# Patient Record
Sex: Female | Born: 1970 | Race: White | Hispanic: No | Marital: Married
Health system: Southern US, Community
[De-identification: ages and names within clinical notes are randomized; demographics above are authoritative.]

## PROBLEM LIST (undated history)

## (undated) DIAGNOSIS — F32A Depression, unspecified: Secondary | ICD-10-CM

## (undated) DIAGNOSIS — R011 Cardiac murmur, unspecified: Secondary | ICD-10-CM

## (undated) DIAGNOSIS — D649 Anemia, unspecified: Secondary | ICD-10-CM

## (undated) DIAGNOSIS — F329 Major depressive disorder, single episode, unspecified: Secondary | ICD-10-CM

## (undated) DIAGNOSIS — T7840XA Allergy, unspecified, initial encounter: Secondary | ICD-10-CM

## (undated) HISTORY — DX: Depression, unspecified: F32.A

## (undated) HISTORY — DX: Anemia, unspecified: D64.9

## (undated) HISTORY — DX: Cardiac murmur, unspecified: R01.1

## (undated) HISTORY — PX: OTHER SURGICAL HISTORY: SHX169

## (undated) HISTORY — DX: Major depressive disorder, single episode, unspecified: F32.9

## (undated) HISTORY — DX: Allergy, unspecified, initial encounter: T78.40XA

---

## 2017-05-15 DIAGNOSIS — Z23 Encounter for immunization: Secondary | ICD-10-CM | POA: Diagnosis not present

## 2017-05-26 DIAGNOSIS — Z0000 Encounter for general adult medical examination without abnormal findings: Secondary | ICD-10-CM | POA: Diagnosis not present

## 2017-05-26 DIAGNOSIS — D509 Iron deficiency anemia, unspecified: Secondary | ICD-10-CM | POA: Diagnosis not present

## 2017-05-26 DIAGNOSIS — R221 Localized swelling, mass and lump, neck: Secondary | ICD-10-CM | POA: Diagnosis not present

## 2017-06-06 DIAGNOSIS — D170 Benign lipomatous neoplasm of skin and subcutaneous tissue of head, face and neck: Secondary | ICD-10-CM | POA: Diagnosis not present

## 2017-06-06 DIAGNOSIS — Z1231 Encounter for screening mammogram for malignant neoplasm of breast: Secondary | ICD-10-CM | POA: Diagnosis not present

## 2017-06-29 DIAGNOSIS — D170 Benign lipomatous neoplasm of skin and subcutaneous tissue of head, face and neck: Secondary | ICD-10-CM | POA: Diagnosis not present

## 2017-07-22 DIAGNOSIS — D170 Benign lipomatous neoplasm of skin and subcutaneous tissue of head, face and neck: Secondary | ICD-10-CM | POA: Diagnosis not present

## 2017-07-22 DIAGNOSIS — D1779 Benign lipomatous neoplasm of other sites: Secondary | ICD-10-CM | POA: Diagnosis not present

## 2017-07-24 DIAGNOSIS — Z87891 Personal history of nicotine dependence: Secondary | ICD-10-CM | POA: Diagnosis not present

## 2017-07-24 DIAGNOSIS — J392 Other diseases of pharynx: Secondary | ICD-10-CM | POA: Diagnosis not present

## 2017-07-24 DIAGNOSIS — R220 Localized swelling, mass and lump, head: Secondary | ICD-10-CM | POA: Diagnosis not present

## 2017-07-24 DIAGNOSIS — L299 Pruritus, unspecified: Secondary | ICD-10-CM | POA: Diagnosis not present

## 2017-07-31 DIAGNOSIS — H5213 Myopia, bilateral: Secondary | ICD-10-CM | POA: Diagnosis not present

## 2018-01-28 ENCOUNTER — Ambulatory Visit (INDEPENDENT_AMBULATORY_CARE_PROVIDER_SITE_OTHER): Payer: BLUE CROSS/BLUE SHIELD | Admitting: Emergency Medicine

## 2018-01-28 ENCOUNTER — Telehealth: Payer: Self-pay | Admitting: *Deleted

## 2018-01-28 ENCOUNTER — Other Ambulatory Visit: Payer: Self-pay

## 2018-01-28 ENCOUNTER — Encounter: Payer: Self-pay | Admitting: Emergency Medicine

## 2018-01-28 ENCOUNTER — Ambulatory Visit (HOSPITAL_COMMUNITY)
Admission: RE | Admit: 2018-01-28 | Discharge: 2018-01-28 | Disposition: A | Discharge status: Home / Self Care | Payer: BLUE CROSS/BLUE SHIELD | Source: Ambulatory Visit | Attending: Emergency Medicine | Admitting: Emergency Medicine

## 2018-01-28 VITALS — BP 110/70 | HR 83 | Temp 98.0°F | Resp 16 | Ht 62.0 in | Wt 148.8 lb

## 2018-01-28 DIAGNOSIS — R102 Pelvic and perineal pain: Secondary | ICD-10-CM | POA: Diagnosis not present

## 2018-01-28 DIAGNOSIS — M5440 Lumbago with sciatica, unspecified side: Secondary | ICD-10-CM | POA: Diagnosis not present

## 2018-01-28 LAB — POCT URINALYSIS DIP (MANUAL ENTRY)
Bilirubin, UA: NEGATIVE
Blood, UA: NEGATIVE
Glucose, UA: NEGATIVE mg/dL
Ketones, POC UA: NEGATIVE mg/dL
Leukocytes, UA: NEGATIVE
Nitrite, UA: NEGATIVE
Protein Ur, POC: NEGATIVE mg/dL
Spec Grav, UA: 1.01 (ref 1.010–1.025)
Urobilinogen, UA: 0.2 E.U./dL
pH, UA: 6.5 (ref 5.0–8.0)

## 2018-01-28 LAB — POC MICROSCOPIC URINALYSIS (UMFC): Mucus: ABSENT

## 2018-01-28 NOTE — Progress Notes (Signed)
[facility] 47 [facility]   Chief Complaint  Patient presents with  . New pt  . Back Pain    lower x 1 wk ago, "moved to the front, right lower abdomen, now in the left mid back x 2 days ago    HISTORY OF PRESENT ILLNESS: This is a 47 y.o. female complaining of pain to right lower back 1 week ago.  Pain then moved to the right lower abdomen and now is on the left low back for the past 2 days.  Still eating and drinking well.  Denies nausea or vomiting.  No fever or chills.  No abnormal vaginal discharge or bleeding.  Denies urinary symptoms or blood in the urine.  No other significant symptomatology.  Denies injuries.  Describes pain as a dull ache and intermittent.  No associated symptoms.  HPI   Prior to Admission medications   Not on File    Allergies not on file  There are no active problems to display for this patient.   Past Medical History:  Diagnosis Date  . Allergy   . Anemia   . Depression   . Heart murmur       Social History   Socioeconomic History  . Marital status: Married    Spouse name: Not on file  . Number of children: Not on file  . Years of education: Not on file  . Highest education level: Not on file  Occupational History  . Not on file  Social Needs  . Financial resource strain: Not on file  . Food insecurity:    Worry: Not on file    Inability: Not on file  . Transportation needs:    Medical: Not on file    Non-medical: Not on file  Tobacco Use  . Smoking status: Former Games developermoker  . Smokeless tobacco: Never Used  Substance and Sexual Activity  . Alcohol use: Yes    Alcohol/week: 0.6 oz    Types: 1 Standard drinks or equivalent per week  . Drug use: Never  . Sexual activity: Not on file  Lifestyle  . Physical activity:    Days per week: Not on file    Minutes per session: Not on file  . Stress: Not on file  Relationships  . Social connections:    Talks on phone: Not on file    Gets together: Not on file    Attends religious  service: Not on file    Active member of club or organization: Not on file    Attends meetings of clubs or organizations: Not on file    Relationship status: Not on file  . Intimate partner violence:    Fear of current or ex partner: Not on file    Emotionally abused: Not on file    Physically abused: Not on file    Forced sexual activity: Not on file  Other Topics Concern  . Not on file  Social History Narrative  . Not on file    Family History  Problem Relation Age of Onset  . Diabetes Mother   . Hyperlipidemia Mother   . Hypertension Mother   . Heart disease Maternal Grandmother   . Cancer Maternal Grandfather        Bone cancer  . Heart disease Paternal Grandfather      Review of Systems  Constitutional: Negative.  Negative for chills and fever.  HENT: Negative.  Negative for sore throat.   Eyes: Negative.   Respiratory: Negative.  Negative for  cough and shortness of breath.   Cardiovascular: Negative.  Negative for chest pain, palpitations and leg swelling.  Gastrointestinal: Negative.  Negative for abdominal pain, blood in stool, diarrhea, nausea and vomiting.  Genitourinary: Negative.  Negative for dysuria, flank pain, frequency, hematuria and urgency.  Musculoskeletal: Negative.  Negative for back pain, myalgias and neck pain.  Skin: Negative.  Negative for rash.  Neurological: Negative.  Negative for dizziness and headaches.  Endo/Heme/Allergies: Negative.   All other systems reviewed and are negative.  Vitals:   01/28/18 1119  BP: 110/70  Pulse: 83  Resp: 16  Temp: 98 F (36.7 C)  SpO2: 99%     Physical Exam  Constitutional: She is oriented to person, place, and time. She appears well-developed and well-nourished.  HENT:  Head: Normocephalic and atraumatic.  Nose: Nose normal.  Mouth/Throat: Oropharynx is clear and moist.  Eyes: Pupils are equal, round, and reactive to light. Conjunctivae and EOM are normal.  Neck: Normal range of motion. Neck  supple. No JVD present.  Cardiovascular: Normal rate, regular rhythm and normal heart sounds.  Pulmonary/Chest: Effort normal and breath sounds normal.  Abdominal: Soft. She exhibits no distension. There is no hepatosplenomegaly. There is tenderness in the right lower quadrant. There is no rebound and no guarding.    Musculoskeletal: Normal range of motion.       Lumbar back: Normal. She exhibits normal range of motion, no tenderness, no bony tenderness, no swelling, no edema, no spasm and normal pulse.  Lymphadenopathy:    She has no cervical adenopathy.  Neurological: She is alert and oriented to person, place, and time.  Skin: Skin is warm and dry. Capillary refill takes less than 2 seconds.  Psychiatric: She has a normal mood and affect. Her behavior is normal.  Vitals reviewed.   Results for orders placed or performed in visit on 01/28/18 (from the past 24 hour(s))  POCT urinalysis dipstick     Status: None   Collection Time: 01/28/18 11:37 AM  Result Value Ref Range   Color, UA yellow yellow   Clarity, UA clear clear   Glucose, UA negative negative mg/dL   Bilirubin, UA negative negative   Ketones, POC UA negative negative mg/dL   Spec Grav, UA 6.213 0.865 - 1.025   Blood, UA negative negative   pH, UA 6.5 5.0 - 8.0   Protein Ur, POC negative negative mg/dL   Urobilinogen, UA 0.2 0.2 or 1.0 E.U./dL   Nitrite, UA Negative Negative   Leukocytes, UA Negative Negative  POCT Microscopic Urinalysis ([facility])     Status: Abnormal   Collection Time: 01/28/18 11:42 AM  Result Value Ref Range   WBC,UR,HPF,POC None None WBC/hpf   RBC,UR,HPF,POC None None RBC/hpf   Bacteria None None, Too numerous to count   Mucus Absent Absent   Epithelial Cells, UR Per Microscopy Few (A) None, Too numerous to count cells/hpf    ASSESSMENT & PLAN: Alyzae was seen today for new pt and back pain.  Diagnoses and all orders for this visit:  Midline low back pain with sciatica, sciatica laterality  unspecified, unspecified chronicity -     POCT urinalysis dipstick -     POCT Microscopic Urinalysis ([facility]) -     Urine Culture  Pelvic pain -     CBC with Differential/Platelet -     Comprehensive metabolic panel -     Cancel: US Pelvis Complete; Future -     Cancel: US PELVIC COMPLETE WITH TRANSVAGINAL; Future -  US PELVIC COMPLETE WITH TRANSVAGINAL; Future    Patient Instructions   [facility] AT 2:00 pm TODAY. PLEASE ARRIVE AT 1:45 PM. GO TO THE RADIOLOGY DEPARTMENT FOR A PELVIC ULTRASOUND.    IF you received an x-ray today, you will receive an invoice from [facility] Radiology. Please contact [facility] at [phone] with questions or concerns regarding your invoice.   IF you received labwork today, you will receive an invoice from [place]. Please contact [facility] at [phone] with questions or concerns regarding your invoice.   Our billing staff will not be able to assist you with questions regarding bills from these companies.  You will be contacted with the lab results as soon as they are available. The fastest way to get your results is to activate your My Chart account. Instructions are located on the last page of this paperwork. If you have not heard from [place] regarding the results in 2 weeks, please contact this office.     Abdominal Pain, Adult Many things can cause belly (abdominal) pain. Most times, belly pain is not dangerous. Many cases of belly pain can be watched and treated at home. Sometimes belly pain is serious, though. Your doctor will try to find the cause of your belly pain. Follow these instructions at home:  Take over-the-counter and prescription medicines only as told by your doctor. Do not take medicines that help you poop (laxatives) unless told to by your doctor.  Drink enough fluid to keep your pee (urine) clear or pale yellow.  Watch your belly pain for any changes.  Keep all follow-up visits as told by your doctor.  This is important. Contact a doctor if:  Your belly pain changes or gets worse.  You are not hungry, or you lose weight without trying.  You are having trouble pooping (constipated) or have watery poop (diarrhea) for more than 2-3 days.  You have pain when you pee or poop.  Your belly pain wakes you up at night.  Your pain gets worse with meals, after eating, or with certain foods.  You are throwing up and cannot keep anything down.  You have a fever. Get help right away if:  Your pain does not go away as soon as your doctor says it should.  You cannot stop throwing up.  Your pain is only in areas of your belly, such as the right side or the left lower part of the belly.  You have bloody or black poop, or poop that looks like tar.  You have very bad pain, cramping, or bloating in your belly.  You have signs of not having enough fluid or water in your body (dehydration), such as: ? Dark pee, very little pee, or no pee. ? Cracked lips. ? Dry mouth. ? Sunken eyes. ? Sleepiness. ? Weakness. This information is not intended to replace advice given to you by your health care provider. Make sure you discuss any questions you have with your health care provider. Document Released: 12/17/2007 Document Revised: 01/18/2016 Document Reviewed: 12/12/2015 Elsevier Interactive Patient Education  2018 Elsevier Inc.      Edwina Barth, MD [facility]

## 2018-01-28 NOTE — Patient Instructions (Addendum)
[facility] AT 2:00 pm TODAY. PLEASE ARRIVE AT 1:45 PM. GO TO THE RADIOLOGY DEPARTMENT FOR A PELVIC ULTRASOUND.    IF you received an x-ray today, you will [facility] an [facility]. Please contact [facility] at [phone] with questions or concerns regarding your invoice.   IF you received labwork today, you will receive an invoice from [facility]. Please contact [facility] at [phone] with questions or concerns regarding your invoice.   Our billing staff will not be able to assist you with questions regarding bills from these companies.  You will be contacted with the lab results as soon as they are available. The fastest way to get your results is to activate your My Chart account. Instructions are located on the last page of this paperwork. If you have not heard from us regarding the results in 2 weeks, please contact this office.     Abdominal Pain, Adult Many things can cause belly (abdominal) pain. Most times, belly pain is not dangerous. Many cases of belly pain can be watched and treated at home. Sometimes belly pain is serious, though. Your doctor will try to find the cause of your belly pain. Follow these instructions at home:  Take over-the-counter and prescription medicines only as told by your doctor. Do not take medicines that help you poop (laxatives) unless told to by your doctor.  Drink enough fluid to keep your pee (urine) clear or pale yellow.  Watch your belly pain for any changes.  Keep all follow-up visits as told by your doctor. This is important. Contact a doctor if:  Your belly pain changes or gets worse.  You are not hungry, or you lose weight without trying.  You are having trouble pooping (constipated) or have watery poop (diarrhea) for more than 2-3 days.  You have pain when you pee or poop.  Your belly pain wakes you up at night.  Your pain gets worse with meals, after eating, or with certain foods.  You are  throwing up and cannot keep anything down.  You have a fever. Get help right away if:  Your pain does not go away as soon as your doctor says it should.  You cannot stop throwing up.  Your pain is only in areas of your belly, such as the right side or the left lower part of the belly.  You have bloody or black poop, or poop that looks like tar.  You have very bad pain, cramping, or bloating in your belly.  You have signs of not having enough fluid or water in your body (dehydration), such as: ? Dark pee, very little pee, or no pee. ? Cracked lips. ? Dry mouth. ? Sunken eyes. ? Sleepiness. ? Weakness. This information is not intended to replace advice given to you by your health care provider. Make sure you discuss any questions you have with your health care provider. Document Released: 12/17/2007 Document Revised: 01/18/2016 Document Reviewed: 12/12/2015 Elsevier Interactive Patient Education  2018 [employer]

## 2018-01-28 NOTE — Telephone Encounter (Signed)
Spoke with Ginger, Scheduler. Patient given AVS with information [facility] at 1:45 pm for a 2:00 pm appointment with a full bladder. Pt advised to arrive  at the Radiology Department. Ginger was advised that the patient doesn't have to wait, she may leave and the call back number [phone] per Dr. Alvy BimlerSagardia was given for a call report.

## 2018-01-29 ENCOUNTER — Telehealth: Payer: Self-pay | Admitting: Emergency Medicine

## 2018-01-29 LAB — URINE CULTURE

## 2018-01-29 LAB — CBC WITH DIFFERENTIAL/PLATELET
Basophils Absolute: 0 10*3/uL (ref 0.0–0.2)
Basos: 1 %
EOS (ABSOLUTE): 0.1 10*3/uL (ref 0.0–0.4)
Eos: 1 %
Hematocrit: 34.6 % (ref 34.0–46.6)
Hemoglobin: 10.7 g/dL — ABNORMAL LOW (ref 11.1–15.9)
Immature Grans (Abs): 0 10*3/uL (ref 0.0–0.1)
Immature Granulocytes: 0 %
Lymphocytes Absolute: 2.1 10*3/uL (ref 0.7–3.1)
Lymphs: 41 %
MCH: 20 pg — ABNORMAL LOW (ref 26.6–33.0)
MCHC: 30.9 g/dL — ABNORMAL LOW (ref 31.5–35.7)
MCV: 65 fL — ABNORMAL LOW (ref 79–97)
Monocytes Absolute: 0.2 10*3/uL (ref 0.1–0.9)
Monocytes: 4 %
Neutrophils Absolute: 2.8 10*3/uL (ref 1.4–7.0)
Neutrophils: 53 %
Platelets: 271 10*3/uL (ref 150–450)
RBC: 5.34 x10E6/uL — ABNORMAL HIGH (ref 3.77–5.28)
RDW: 16.4 % — ABNORMAL HIGH (ref 12.3–15.4)
WBC: 5.3 10*3/uL (ref 3.4–10.8)

## 2018-01-29 LAB — COMPREHENSIVE METABOLIC PANEL
ALT: 17 IU/L (ref 0–32)
AST: 13 IU/L (ref 0–40)
Albumin/Globulin Ratio: 1.9 (ref 1.2–2.2)
Albumin: 4.8 g/dL (ref 3.5–5.5)
Alkaline Phosphatase: 61 IU/L (ref 39–117)
BUN/Creatinine Ratio: 16 (ref 9–23)
BUN: 12 mg/dL (ref 6–24)
Bilirubin Total: 1.8 mg/dL — ABNORMAL HIGH (ref 0.0–1.2)
CO2: 24 mmol/L (ref 20–29)
Calcium: 9.8 mg/dL (ref 8.7–10.2)
Chloride: 103 mmol/L (ref 96–106)
Creatinine, Ser: 0.74 mg/dL (ref 0.57–1.00)
GFR calc Af Amer: 110 mL/min/{1.73_m2} (ref >59)
GFR calc non Af Amer: 95 mL/min/{1.73_m2} (ref >59)
Globulin, Total: 2.5 g/dL (ref 1.5–4.5)
Glucose: 83 mg/dL (ref 65–99)
Potassium: 4.2 mmol/L (ref 3.5–5.2)
Sodium: 142 mmol/L (ref 134–144)
Total Protein: 7.3 g/dL (ref 6.0–8.5)

## 2018-01-29 NOTE — Telephone Encounter (Signed)
Lab results and ultrasound results discussed with patient.  She is doing better today.

## 2018-01-30 ENCOUNTER — Encounter: Payer: Self-pay | Admitting: *Deleted

## 2018-01-30 NOTE — Progress Notes (Signed)
Letter sent.

## 2018-10-31 IMAGING — US US PELVIS COMPLETE TRANSABD/TRANSVAG
1 series · 14 of 25 positions shown · non-contrast
Comparison: None

CLINICAL DATA: Pelvic pain



[Series 1: us pelvis complete transabd/transvag · 0.24mm/px · 14 of 82 slices shown]
[im 1/82]
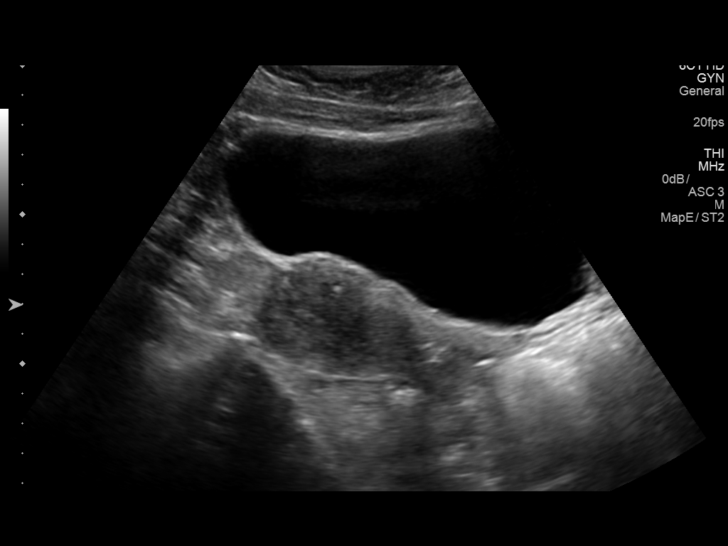
[im 7/82]
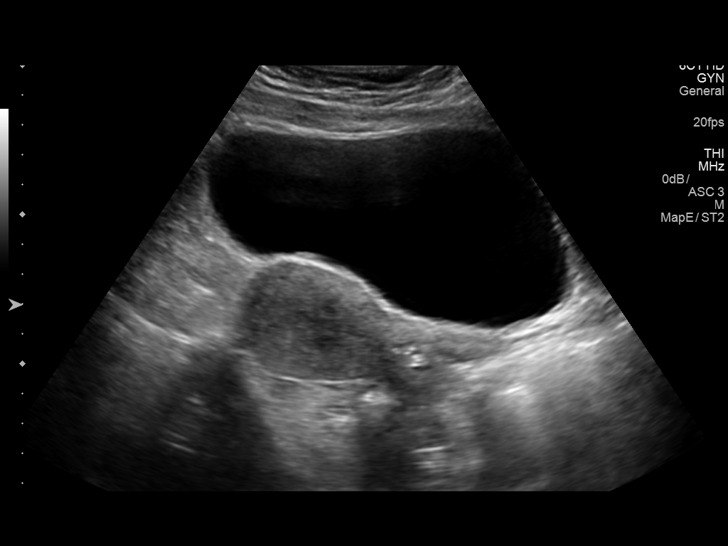
[im 14/82]
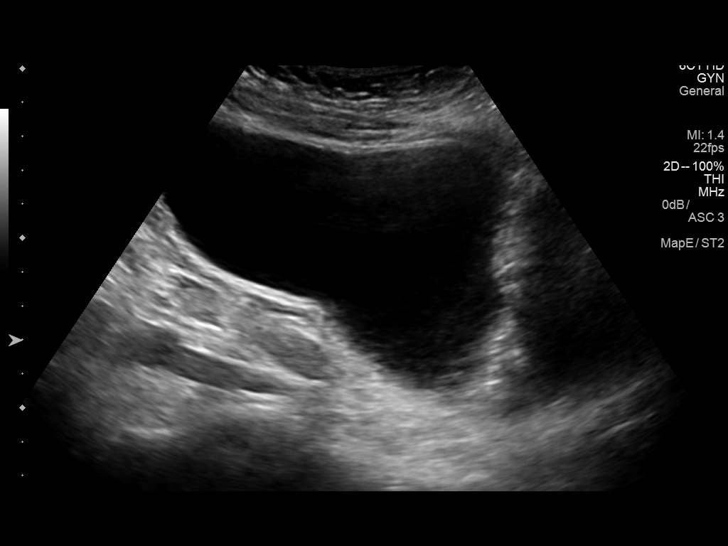
[im 21/82]
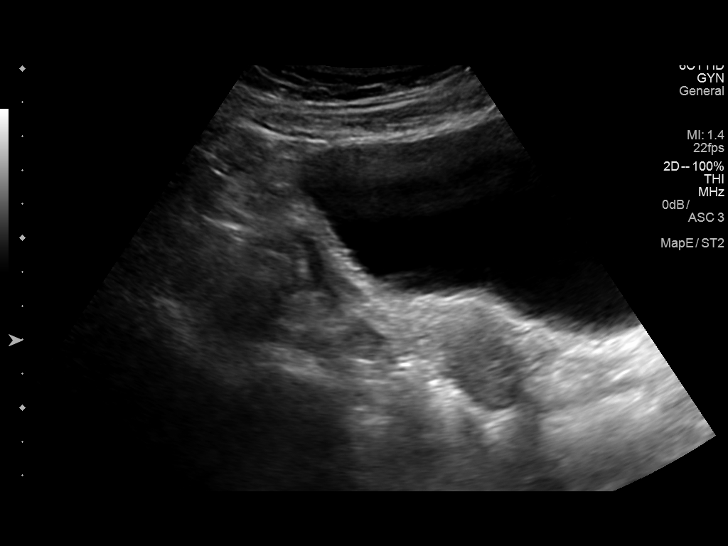
[im 28/82]
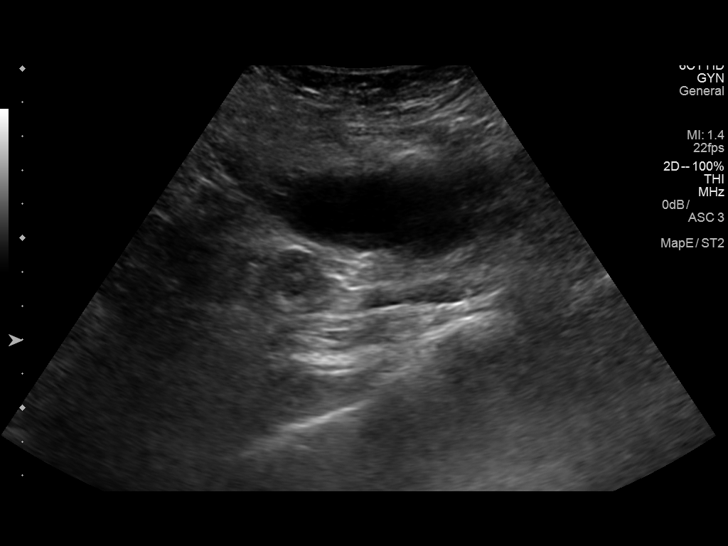
[im 31/82]
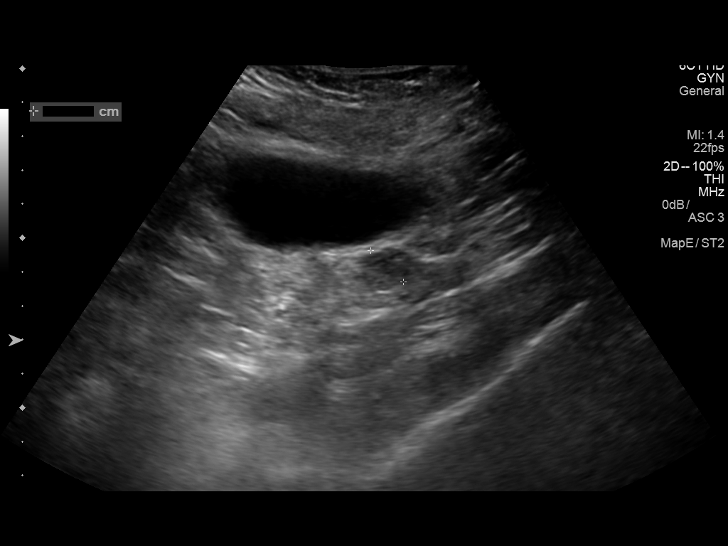
[im 38/82]
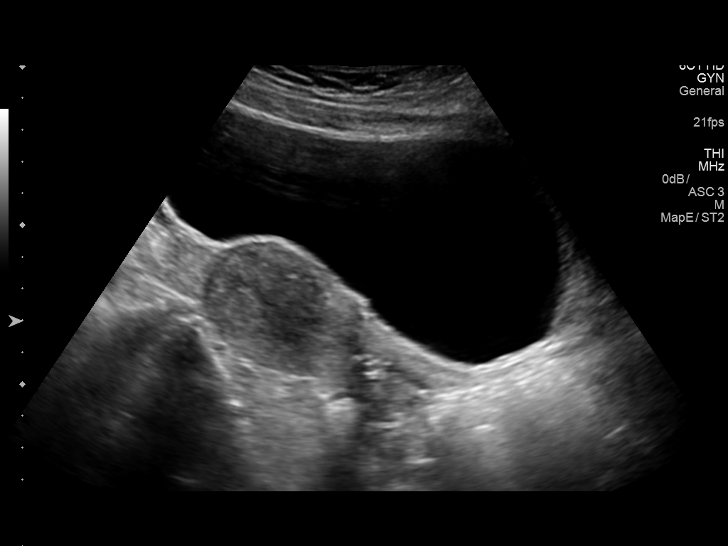
[im 44/82]
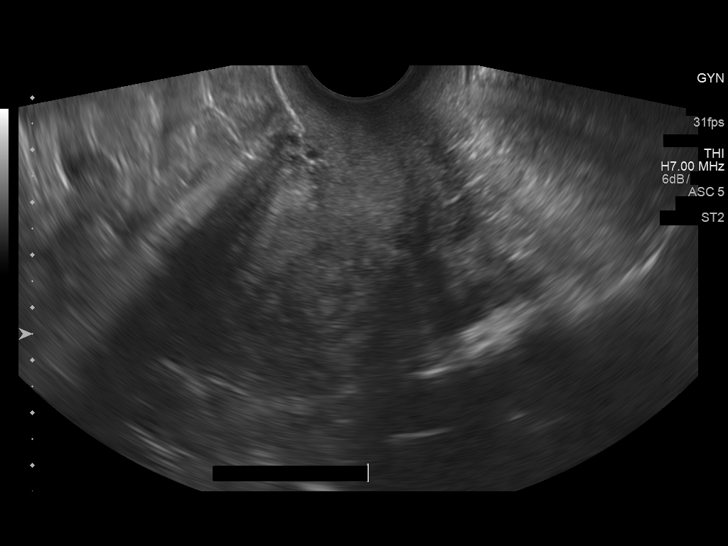
[im 51/82]
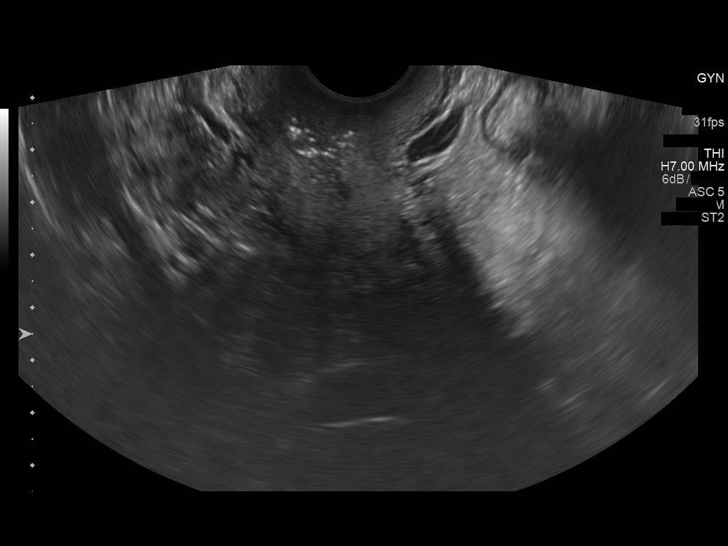
[im 55/82]
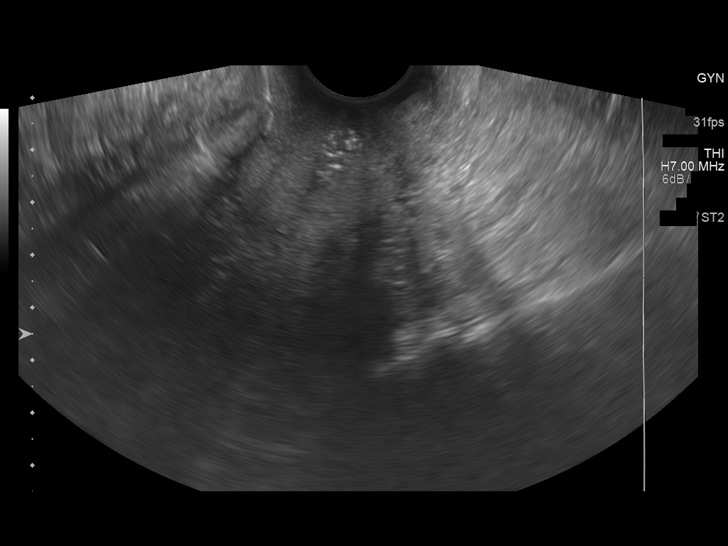
[im 61/82]
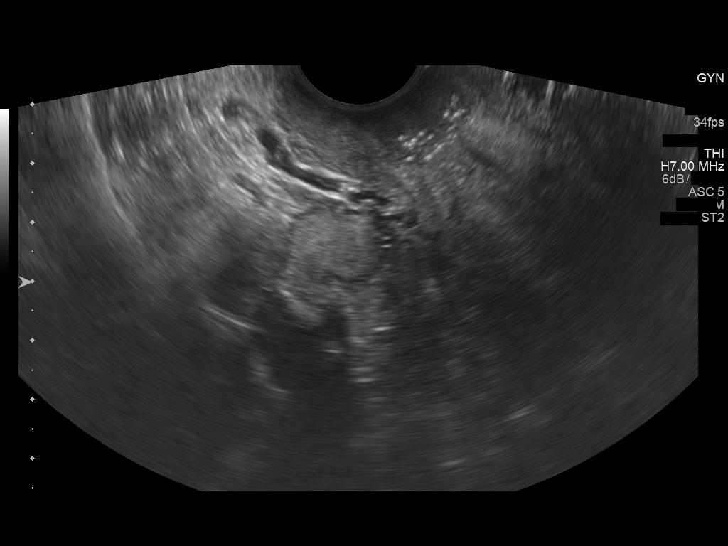
[im 68/82]
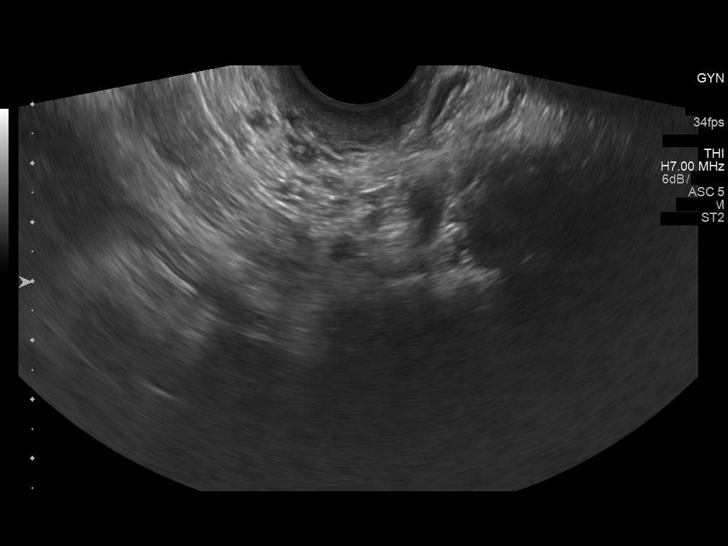
[im 75/82]
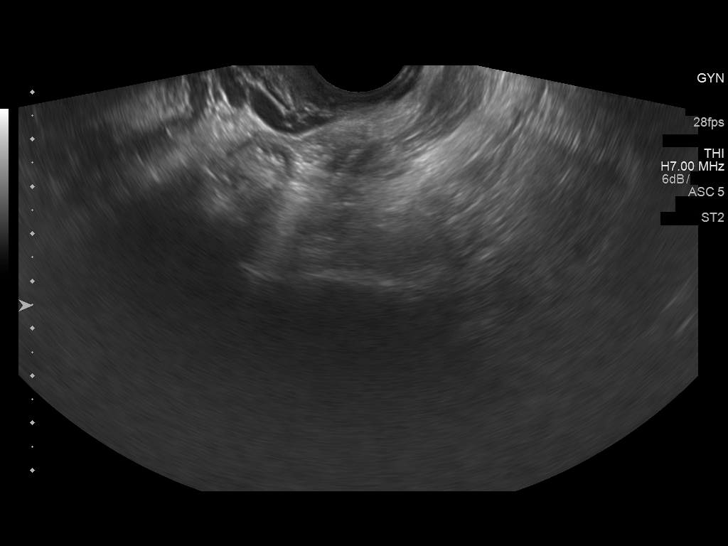
[im 82/82]
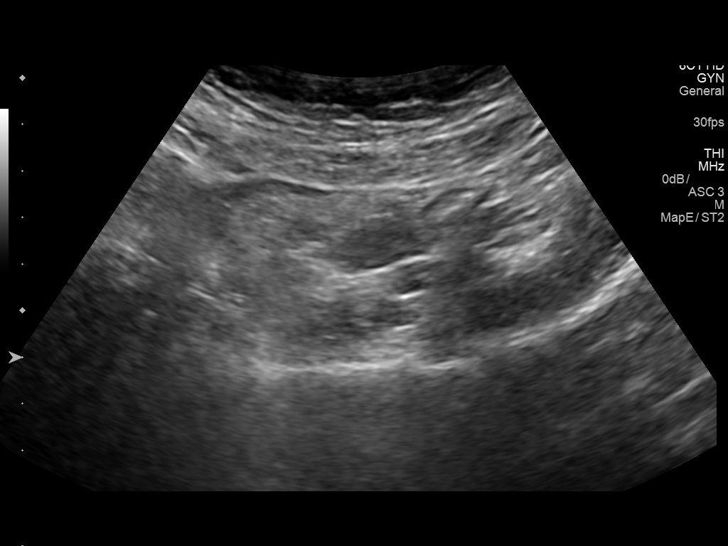

[14 of 25 positions shown; findings below may reference images not displayed]

FINDINGS: Uterus

Measurements: 6.6 x 4.0 x 4.4 cm. No fibroids or other mass
visualized.

Endometrium

Thickness: 4 mm in thickness.  No focal abnormality visualized.

Right ovary

Measurements: 2.1 x 1.2 x 1.9 cm. Normal appearance/no adnexal mass.

Left ovary

Measurements: 2.1 x 1.5 x 1.3 cm. Normal appearance/no adnexal mass.

Other findings

No abnormal free fluid.
IMPRESSION: Unremarkable pelvic ultrasound.

## 2019-08-22 DIAGNOSIS — M5136 Other intervertebral disc degeneration, lumbar region: Secondary | ICD-10-CM | POA: Diagnosis not present

## 2019-08-22 DIAGNOSIS — M5416 Radiculopathy, lumbar region: Secondary | ICD-10-CM | POA: Diagnosis not present

## 2019-08-22 DIAGNOSIS — M545 Low back pain: Secondary | ICD-10-CM | POA: Diagnosis not present

## 2019-08-26 DIAGNOSIS — M545 Low back pain: Secondary | ICD-10-CM | POA: Diagnosis not present

## 2019-08-31 DIAGNOSIS — M545 Low back pain: Secondary | ICD-10-CM | POA: Diagnosis not present

## 2019-09-10 ENCOUNTER — Ambulatory Visit: Payer: Self-pay | Attending: Internal Medicine

## 2019-09-10 DIAGNOSIS — Z23 Encounter for immunization: Secondary | ICD-10-CM | POA: Insufficient documentation

## 2019-09-10 NOTE — Progress Notes (Signed)
   Covid-19 Vaccination Clinic  Name:  Jannely Henthorn    MRN: [id number] DOB: February 23, 1971  09/10/2019  Ms. Service was observed post Covid-19 immunization for 15 minutes without incidence. She was provided with Vaccine Information Sheet and instruction to access the V-Safe system.   Ms. Lees was instructed to call 911 with any severe reactions post vaccine: Marland Kitchen Difficulty breathing  . Swelling of your face and throat  . A fast heartbeat  . A bad rash all over your body  . Dizziness and weakness    Immunizations Administered    Name Date Dose VIS Date Route   Pfizer COVID-19 Vaccine 09/10/2019 10:48 AM 0.3 mL 06/24/2019 Intramuscular   Manufacturer: [employer], [employer]   Lot: [id number]   NDC: [address]-9

## 2019-09-12 DIAGNOSIS — M545 Low back pain: Secondary | ICD-10-CM | POA: Diagnosis not present

## 2019-09-15 DIAGNOSIS — M545 Low back pain: Secondary | ICD-10-CM | POA: Diagnosis not present

## 2019-09-20 DIAGNOSIS — M545 Low back pain: Secondary | ICD-10-CM | POA: Diagnosis not present

## 2019-09-22 DIAGNOSIS — M545 Low back pain: Secondary | ICD-10-CM | POA: Diagnosis not present

## 2019-09-27 DIAGNOSIS — M545 Low back pain: Secondary | ICD-10-CM | POA: Diagnosis not present

## 2019-10-01 ENCOUNTER — Ambulatory Visit: Payer: Self-pay | Attending: Internal Medicine

## 2019-10-01 DIAGNOSIS — Z23 Encounter for immunization: Secondary | ICD-10-CM

## 2019-10-01 NOTE — Progress Notes (Signed)
   Covid-19 Vaccination Clinic  Name:  Jill Swanson    MRN: [id number] DOB: 1970/10/31  10/01/2019  [name] was observed post Covid-19 immunization for 30 minutes without incident. She was provided with Vaccine Information Sheet and instruction to access the V-Safe system.   [name] was instructed to call 911 with any severe reactions post vaccine: Marland Kitchen Difficulty breathing  . Swelling of face and throat  . A fast heartbeat  . A bad rash all over body  . Dizziness and weakness   Immunizations Administered    Name Date Dose VIS Date Route   Pfizer COVID-19 Vaccine 10/01/2019 10:50 AM 0.3 mL 06/24/2019 Intramuscular   Manufacturer: [employer], [employer]   Lot: [id number]   NDC: [address]-1

## 2019-10-05 ENCOUNTER — Ambulatory Visit: Payer: Self-pay

## 2019-10-12 DIAGNOSIS — M5136 Other intervertebral disc degeneration, lumbar region: Secondary | ICD-10-CM | POA: Diagnosis not present

## 2019-10-17 DIAGNOSIS — M545 Low back pain: Secondary | ICD-10-CM | POA: Diagnosis not present

## 2020-07-26 DIAGNOSIS — Z13220 Encounter for screening for lipoid disorders: Secondary | ICD-10-CM | POA: Diagnosis not present

## 2020-07-26 DIAGNOSIS — Z0000 Encounter for general adult medical examination without abnormal findings: Secondary | ICD-10-CM | POA: Diagnosis not present

## 2020-07-26 DIAGNOSIS — M2550 Pain in unspecified joint: Secondary | ICD-10-CM | POA: Diagnosis not present

## 2020-11-07 DIAGNOSIS — M25521 Pain in right elbow: Secondary | ICD-10-CM | POA: Diagnosis not present

## 2020-11-07 DIAGNOSIS — M25511 Pain in right shoulder: Secondary | ICD-10-CM | POA: Diagnosis not present

## 2020-11-08 DIAGNOSIS — M25511 Pain in right shoulder: Secondary | ICD-10-CM | POA: Diagnosis not present

## 2020-11-08 DIAGNOSIS — M25521 Pain in right elbow: Secondary | ICD-10-CM | POA: Diagnosis not present

## 2020-11-13 DIAGNOSIS — M25511 Pain in right shoulder: Secondary | ICD-10-CM | POA: Diagnosis not present

## 2020-11-13 DIAGNOSIS — M25521 Pain in right elbow: Secondary | ICD-10-CM | POA: Diagnosis not present

## 2020-11-16 DIAGNOSIS — M25511 Pain in right shoulder: Secondary | ICD-10-CM | POA: Diagnosis not present

## 2020-11-16 DIAGNOSIS — M25521 Pain in right elbow: Secondary | ICD-10-CM | POA: Diagnosis not present

## 2020-11-21 DIAGNOSIS — M25511 Pain in right shoulder: Secondary | ICD-10-CM | POA: Diagnosis not present

## 2020-11-21 DIAGNOSIS — M25521 Pain in right elbow: Secondary | ICD-10-CM | POA: Diagnosis not present

## 2020-11-23 DIAGNOSIS — M25521 Pain in right elbow: Secondary | ICD-10-CM | POA: Diagnosis not present

## 2020-11-23 DIAGNOSIS — M25511 Pain in right shoulder: Secondary | ICD-10-CM | POA: Diagnosis not present

## 2020-11-28 DIAGNOSIS — M25521 Pain in right elbow: Secondary | ICD-10-CM | POA: Diagnosis not present

## 2020-11-28 DIAGNOSIS — M25511 Pain in right shoulder: Secondary | ICD-10-CM | POA: Diagnosis not present

## 2020-11-30 DIAGNOSIS — M25521 Pain in right elbow: Secondary | ICD-10-CM | POA: Diagnosis not present

## 2020-11-30 DIAGNOSIS — M25511 Pain in right shoulder: Secondary | ICD-10-CM | POA: Diagnosis not present

## 2020-12-04 DIAGNOSIS — M25521 Pain in right elbow: Secondary | ICD-10-CM | POA: Diagnosis not present

## 2020-12-04 DIAGNOSIS — M25511 Pain in right shoulder: Secondary | ICD-10-CM | POA: Diagnosis not present

## 2020-12-07 DIAGNOSIS — M25521 Pain in right elbow: Secondary | ICD-10-CM | POA: Diagnosis not present

## 2020-12-07 DIAGNOSIS — M25511 Pain in right shoulder: Secondary | ICD-10-CM | POA: Diagnosis not present

## 2020-12-21 DIAGNOSIS — M25521 Pain in right elbow: Secondary | ICD-10-CM | POA: Diagnosis not present

## 2020-12-21 DIAGNOSIS — M25511 Pain in right shoulder: Secondary | ICD-10-CM | POA: Diagnosis not present

## 2021-02-09 ENCOUNTER — Emergency Department (HOSPITAL_COMMUNITY): Payer: BC Managed Care – PPO

## 2021-02-09 ENCOUNTER — Emergency Department (HOSPITAL_COMMUNITY)
Admission: EM | Admit: 2021-02-09 | Discharge: 2021-02-09 | Disposition: A | Discharge status: Home / Self Care | Payer: BC Managed Care – PPO | Attending: Emergency Medicine | Admitting: Emergency Medicine

## 2021-02-09 ENCOUNTER — Other Ambulatory Visit: Payer: Self-pay

## 2021-02-09 DIAGNOSIS — R0602 Shortness of breath: Secondary | ICD-10-CM | POA: Diagnosis not present

## 2021-02-09 DIAGNOSIS — R0789 Other chest pain: Secondary | ICD-10-CM | POA: Insufficient documentation

## 2021-02-09 DIAGNOSIS — R079 Chest pain, unspecified: Secondary | ICD-10-CM

## 2021-02-09 LAB — CBC WITH DIFFERENTIAL/PLATELET
Abs Immature Granulocytes: 0.02 10*3/uL (ref 0.00–0.07)
Basophils Absolute: 0.1 10*3/uL (ref 0.0–0.1)
Basophils Relative: 1 %
Eosinophils Absolute: 0 10*3/uL (ref 0.0–0.5)
Eosinophils Relative: 0 %
HCT: 40.4 % (ref 36.0–46.0)
Hemoglobin: 12.3 g/dL (ref 12.0–15.0)
Immature Granulocytes: 0 %
Lymphocytes Relative: 24 %
Lymphs Abs: 1.7 10*3/uL (ref 0.7–4.0)
MCH: 20.6 pg — ABNORMAL LOW (ref 26.0–34.0)
MCHC: 30.4 g/dL (ref 30.0–36.0)
MCV: 67.8 fL — ABNORMAL LOW (ref 80.0–100.0)
Monocytes Absolute: 0.3 10*3/uL (ref 0.1–1.0)
Monocytes Relative: 4 %
Neutro Abs: 4.9 10*3/uL (ref 1.7–7.7)
Neutrophils Relative %: 71 %
Platelets: 251 10*3/uL (ref 150–400)
RBC: 5.96 MIL/uL — ABNORMAL HIGH (ref 3.87–5.11)
RDW: 15.6 % — ABNORMAL HIGH (ref 11.5–15.5)
WBC: 7 10*3/uL (ref 4.0–10.5)
nRBC: 0 % (ref 0.0–0.2)

## 2021-02-09 LAB — COMPREHENSIVE METABOLIC PANEL
ALT: 27 U/L (ref 0–44)
AST: 24 U/L (ref 15–41)
Albumin: 4.9 g/dL (ref 3.5–5.0)
Alkaline Phosphatase: 59 U/L (ref 38–126)
Anion gap: 16 — ABNORMAL HIGH (ref 5–15)
BUN: 10 mg/dL (ref 6–20)
CO2: 20 mmol/L — ABNORMAL LOW (ref 22–32)
Calcium: 9.9 mg/dL (ref 8.9–10.3)
Chloride: 107 mmol/L (ref 98–111)
Creatinine, Ser: 0.67 mg/dL (ref 0.44–1.00)
GFR, Estimated: >60 mL/min (ref >60)
Glucose, Bld: 104 mg/dL — ABNORMAL HIGH (ref 70–99)
Potassium: 3.7 mmol/L (ref 3.5–5.1)
Sodium: 143 mmol/L (ref 135–145)
Total Bilirubin: 2 mg/dL — ABNORMAL HIGH (ref 0.3–1.2)
Total Protein: 7.7 g/dL (ref 6.5–8.1)

## 2021-02-09 LAB — D-DIMER, QUANTITATIVE: D-Dimer, Quant: 0.33 ug/mL-FEU (ref 0.00–0.50)

## 2021-02-09 LAB — TROPONIN I (HIGH SENSITIVITY): Troponin I (High Sensitivity): <2 ng/L (ref <18)

## 2021-02-09 NOTE — ED Provider Notes (Signed)
[facility]-EMERGENCY DEPT Provider Note   CSN: [id number] Arrival date & time: 02/09/21  1054     History No chief complaint on file.   Jill Swanson is a 50 y.o. female. No significant PMH.  Presenting to ER with concern for chest pain.  Patient states had noted some mild pain Thursday yesterday and today was warm moderate in severity.  Describes it as tightness, worse with deep inspiration, leaning forward.  Has had significant increase in stress.  She denies any major medical problems.  Was told she has mitral valve prolapse many years ago.  Specifically denies history of coronary artery disease, smoking, diabetes, hypertension, hyperlipidemia.  Has had some recent long car rides.  No recent major surgeries.  No prior history of DVT/PE.  No leg pain or leg swelling, abdominal pain nausea vomiting or diarrhea.  No difficulty breathing.  Pain is constant, associated with exertion.  HPI     No past medical history on file.  There are no problems to display for this patient.   OB History   No obstetric history on file.     No family history on file.     Home Medications Prior to Admission medications   Not on File    Allergies    Ibuprofen and Penicillins  Review of Systems   Review of Systems  Constitutional:  Negative for chills and fever.  HENT:  Negative for ear pain and sore throat.   Eyes:  Negative for pain and visual disturbance.  Respiratory:  Positive for chest tightness. Negative for cough and shortness of breath.   Cardiovascular:  Positive for chest pain. Negative for palpitations.  Gastrointestinal:  Negative for abdominal pain and vomiting.  Genitourinary:  Negative for dysuria and hematuria.  Musculoskeletal:  Negative for arthralgias and back pain.  Skin:  Negative for color change and rash.  Neurological:  Negative for seizures and syncope.  All other systems reviewed and are negative.  Physical Exam Updated Vital  Signs BP (!) 133/95 (BP Location: Right Arm)   Pulse 80   Temp 98.8 F (37.1 C) (Oral)   Resp 18   Ht 5\' 2"  (1.575 m)   Wt 79.4 kg   LMP 01/02/2021   SpO2 95%   BMI 32.01 kg/m   Physical Exam Vitals and nursing note reviewed.  Constitutional:      General: She is not in acute distress.    Appearance: She is well-developed.  HENT:     Head: Normocephalic and atraumatic.  Eyes:     Conjunctiva/sclera: Conjunctivae normal.  Cardiovascular:     Rate and Rhythm: Normal rate and regular rhythm.     Heart sounds: No murmur heard. Pulmonary:     Effort: Pulmonary effort is normal. No respiratory distress.     Breath sounds: Normal breath sounds.  Abdominal:     Palpations: Abdomen is soft.     Tenderness: There is no abdominal tenderness.  Musculoskeletal:        General: No deformity or signs of injury.     Cervical back: Neck supple.  Skin:    General: Skin is warm and dry.  Neurological:     General: No focal deficit present.     Mental Status: She is alert.  Psychiatric:        Mood and Affect: Mood normal.    ED Results / Procedures / Treatments   Labs (all labs ordered are listed, but only abnormal results are displayed) Labs  Reviewed  COMPREHENSIVE METABOLIC PANEL - Abnormal; Notable for the following components:      Result Value   CO2 20 (*)    Glucose, Bld 104 (*)    Total Bilirubin 2.0 (*)    Anion gap 16 (*)    All other components within normal limits  CBC WITH DIFFERENTIAL/PLATELET - Abnormal; Notable for the following components:   RBC 5.96 (*)    MCV 67.8 (*)    MCH 20.6 (*)    RDW 15.6 (*)    All other components within normal limits  D-DIMER, QUANTITATIVE  TROPONIN I (HIGH SENSITIVITY)  TROPONIN I (HIGH SENSITIVITY)    EKG EKG Interpretation  Date/Time:  Saturday February 09 2021 11:20:17 EDT Ventricular Rate:  101 PR Interval:  172 QRS Duration: 82 QT Interval:  332 QTC Calculation: 430 R Axis:   -5 Text Interpretation: Sinus  tachycardia Possible Inferior infarct , age undetermined Abnormal ECG No old tracing to compare Confirmed by Mancel Bale [phone]) on 02/09/2021 11:28:42 AM  Radiology DG Chest Portable 1 View  Result Date: 02/09/2021 CLINICAL DATA:  Short of breath.  Chest tightness. EXAM: PORTABLE CHEST 1 VIEW COMPARISON:  None. FINDINGS: Heart size and mediastinal contours appear normal. No pleural effusion or edema. No airspace densities identified. Visualized osseous structures are unremarkable. IMPRESSION: No active disease. Electronically Signed   By: Signa Kell M.D.   On: 02/09/2021 11:52    Procedures Procedures   Medications Ordered in ED Medications - No data to display  ED Course  I have reviewed the triage vital signs and the nursing notes.  Pertinent labs & imaging results that were available during my care of the patient were reviewed by me and considered in my medical decision making (see chart for details).    MDM Rules/Calculators/A&P         HEART Score: 1                 50 year old lady presented to ER with concern for chest pain.  On exam she is remarkably well-appearing in no acute distress with stable vital signs.  EKG without acute ischemic change and troponin within normal limits, doubt ACS.  Chest x-ray negative.  D-dimer within normal limits, doubt PE.  Suspect more likely MSK in etiology.  Recommend follow-up with primary doctor, reviewed return precautions and discharged.  After the discussed management above, the patient was determined to be safe for discharge.  The patient was in agreement with this plan and all questions regarding their care were answered.  ED return precautions were discussed and the patient will return to the ED with any significant worsening of condition.  Final Clinical Impression(s) / ED Diagnoses Final diagnoses:  Chest pain, unspecified type    Rx / DC Orders ED Discharge Orders     None        Milagros Loll, MD 02/09/21  1714

## 2021-02-09 NOTE — ED Triage Notes (Signed)
Complains of bilateral chest tightness, pain with deep inspiration since yesterday. Has been sitting in a car a lot the past few days. Endorses SOB. Denies calf pain.

## 2021-02-09 NOTE — ED Provider Notes (Signed)
Emergency Medicine Provider Triage Evaluation Note  Jill Swanson , a 50 y.o. female  was evaluated in triage.  Pt complains of chest tightness.  Patient states her symptoms started yesterday in her upper back between her shoulder blades.  She then woke up today and reports bilateral chest tightness that she describes as circumferential.  Pain with deep inspiration.  Reports associated shortness of breath.  No leg swelling or calf pain.  No hemoptysis.  No oral estrogen use.  No history of blood clots.  Patient does note that she has been in the car recently for long drives.  Physical Exam  BP 139/81 (BP Location: Right Arm)   Pulse (!) 104   Temp 98.9 F (37.2 C) (Oral)   Resp 18   SpO2 100%  Gen:   Awake, no distress   Resp:  Normal effort  MSK:   Moves extremities without difficulty  Other:    Medical Decision Making  Medically screening exam initiated at 11:18 AM.  Appropriate orders placed.  Jill Swanson was informed that the remainder of the evaluation will be completed by another provider, this initial triage assessment does not replace that evaluation, and the importance of remaining in the ED until their evaluation is complete.   Placido Sou, PA-C 02/09/21 1120    Mancel Bale, MD 02/09/21 1640

## 2021-02-09 NOTE — Discharge Instructions (Addendum)
Recommend taking Tylenol or Motrin as needed for pain control.  Recommend follow-up with primary care doctor.  Come back to ER if you have any worsening of your pain, develop difficulty in breathing, episodes of passing out or other new concerning symptom.

## 2021-02-12 DIAGNOSIS — M940 Chondrocostal junction syndrome [Tietze]: Secondary | ICD-10-CM | POA: Diagnosis not present

## 2021-06-20 DIAGNOSIS — H5213 Myopia, bilateral: Secondary | ICD-10-CM | POA: Diagnosis not present

## 2021-08-29 DIAGNOSIS — M5459 Other low back pain: Secondary | ICD-10-CM | POA: Diagnosis not present

## 2021-08-29 DIAGNOSIS — M62830 Muscle spasm of back: Secondary | ICD-10-CM | POA: Diagnosis not present

## 2021-09-04 DIAGNOSIS — Z1389 Encounter for screening for other disorder: Secondary | ICD-10-CM | POA: Diagnosis not present

## 2021-09-17 DIAGNOSIS — M545 Low back pain, unspecified: Secondary | ICD-10-CM | POA: Diagnosis not present

## 2021-09-24 DIAGNOSIS — M545 Low back pain, unspecified: Secondary | ICD-10-CM | POA: Diagnosis not present

## 2021-10-02 DIAGNOSIS — M545 Low back pain, unspecified: Secondary | ICD-10-CM | POA: Diagnosis not present

## 2021-10-08 DIAGNOSIS — M545 Low back pain, unspecified: Secondary | ICD-10-CM | POA: Diagnosis not present

## 2021-10-16 DIAGNOSIS — M545 Low back pain, unspecified: Secondary | ICD-10-CM | POA: Diagnosis not present

## 2021-10-22 DIAGNOSIS — M545 Low back pain, unspecified: Secondary | ICD-10-CM | POA: Diagnosis not present

## 2021-10-30 DIAGNOSIS — M545 Low back pain, unspecified: Secondary | ICD-10-CM | POA: Diagnosis not present

## 2021-11-06 DIAGNOSIS — M545 Low back pain, unspecified: Secondary | ICD-10-CM | POA: Diagnosis not present

## 2021-11-19 DIAGNOSIS — M545 Low back pain, unspecified: Secondary | ICD-10-CM | POA: Diagnosis not present

## 2021-11-28 DIAGNOSIS — M545 Low back pain, unspecified: Secondary | ICD-10-CM | POA: Diagnosis not present

## 2021-12-03 DIAGNOSIS — M545 Low back pain, unspecified: Secondary | ICD-10-CM | POA: Diagnosis not present

## 2022-04-07 DIAGNOSIS — M5136 Other intervertebral disc degeneration, lumbar region: Secondary | ICD-10-CM | POA: Diagnosis not present

## 2022-05-07 ENCOUNTER — Emergency Department (HOSPITAL_COMMUNITY): Payer: BC Managed Care – PPO

## 2022-05-07 ENCOUNTER — Inpatient Hospital Stay (HOSPITAL_COMMUNITY)
Admission: EM | Admit: 2022-05-07 | Discharge: 2022-05-10 | DRG: 552 | Disposition: A | Discharge status: Home Health | Payer: BC Managed Care – PPO | Attending: Internal Medicine | Admitting: Internal Medicine

## 2022-05-07 ENCOUNTER — Other Ambulatory Visit: Payer: Self-pay

## 2022-05-07 ENCOUNTER — Encounter (HOSPITAL_COMMUNITY): Payer: Self-pay

## 2022-05-07 DIAGNOSIS — R338 Other retention of urine: Secondary | ICD-10-CM | POA: Diagnosis present

## 2022-05-07 DIAGNOSIS — M5442 Lumbago with sciatica, left side: Secondary | ICD-10-CM

## 2022-05-07 DIAGNOSIS — E872 Acidosis, unspecified: Secondary | ICD-10-CM | POA: Diagnosis not present

## 2022-05-07 DIAGNOSIS — Z886 Allergy status to analgesic agent status: Secondary | ICD-10-CM | POA: Diagnosis not present

## 2022-05-07 DIAGNOSIS — G8929 Other chronic pain: Secondary | ICD-10-CM | POA: Diagnosis not present

## 2022-05-07 DIAGNOSIS — R339 Retention of urine, unspecified: Secondary | ICD-10-CM | POA: Diagnosis present

## 2022-05-07 DIAGNOSIS — Z978 Presence of other specified devices: Secondary | ICD-10-CM | POA: Diagnosis not present

## 2022-05-07 DIAGNOSIS — M5126 Other intervertebral disc displacement, lumbar region: Principal | ICD-10-CM | POA: Diagnosis present

## 2022-05-07 DIAGNOSIS — Z833 Family history of diabetes mellitus: Secondary | ICD-10-CM

## 2022-05-07 DIAGNOSIS — Z880 Allergy status to penicillin: Secondary | ICD-10-CM

## 2022-05-07 DIAGNOSIS — E669 Obesity, unspecified: Secondary | ICD-10-CM | POA: Diagnosis present

## 2022-05-07 DIAGNOSIS — R066 Hiccough: Secondary | ICD-10-CM | POA: Diagnosis present

## 2022-05-07 DIAGNOSIS — Z79899 Other long term (current) drug therapy: Secondary | ICD-10-CM | POA: Diagnosis not present

## 2022-05-07 DIAGNOSIS — K5900 Constipation, unspecified: Secondary | ICD-10-CM | POA: Diagnosis present

## 2022-05-07 DIAGNOSIS — Z8249 Family history of ischemic heart disease and other diseases of the circulatory system: Secondary | ICD-10-CM

## 2022-05-07 DIAGNOSIS — Z83438 Family history of other disorder of lipoprotein metabolism and other lipidemia: Secondary | ICD-10-CM

## 2022-05-07 DIAGNOSIS — M549 Dorsalgia, unspecified: Secondary | ICD-10-CM | POA: Diagnosis not present

## 2022-05-07 DIAGNOSIS — M5441 Lumbago with sciatica, right side: Secondary | ICD-10-CM

## 2022-05-07 DIAGNOSIS — D569 Thalassemia, unspecified: Secondary | ICD-10-CM | POA: Diagnosis not present

## 2022-05-07 DIAGNOSIS — Z87891 Personal history of nicotine dependence: Secondary | ICD-10-CM | POA: Diagnosis not present

## 2022-05-07 DIAGNOSIS — D509 Iron deficiency anemia, unspecified: Secondary | ICD-10-CM | POA: Diagnosis not present

## 2022-05-07 DIAGNOSIS — R000 Tachycardia, unspecified: Secondary | ICD-10-CM | POA: Diagnosis not present

## 2022-05-07 DIAGNOSIS — E538 Deficiency of other specified B group vitamins: Secondary | ICD-10-CM | POA: Diagnosis not present

## 2022-05-07 DIAGNOSIS — Z6834 Body mass index (BMI) 34.0-34.9, adult: Secondary | ICD-10-CM

## 2022-05-07 DIAGNOSIS — M62830 Muscle spasm of back: Secondary | ICD-10-CM | POA: Diagnosis present

## 2022-05-07 DIAGNOSIS — M6281 Muscle weakness (generalized): Secondary | ICD-10-CM | POA: Diagnosis not present

## 2022-05-07 DIAGNOSIS — E860 Dehydration: Secondary | ICD-10-CM | POA: Diagnosis present

## 2022-05-07 DIAGNOSIS — M5136 Other intervertebral disc degeneration, lumbar region: Secondary | ICD-10-CM

## 2022-05-07 LAB — COMPREHENSIVE METABOLIC PANEL
ALT: 25 U/L (ref 0–44)
AST: 21 U/L (ref 15–41)
Albumin: 4.5 g/dL (ref 3.5–5.0)
Alkaline Phosphatase: 72 U/L (ref 38–126)
Anion gap: 11 (ref 5–15)
BUN: 11 mg/dL (ref 6–20)
CO2: 23 mmol/L (ref 22–32)
Calcium: 9.5 mg/dL (ref 8.9–10.3)
Chloride: 109 mmol/L (ref 98–111)
Creatinine, Ser: 0.62 mg/dL (ref 0.44–1.00)
GFR, Estimated: >60 mL/min (ref >60)
Glucose, Bld: 99 mg/dL (ref 70–99)
Potassium: 3.9 mmol/L (ref 3.5–5.1)
Sodium: 143 mmol/L (ref 135–145)
Total Bilirubin: 2.4 mg/dL — ABNORMAL HIGH (ref 0.3–1.2)
Total Protein: 7.6 g/dL (ref 6.5–8.1)

## 2022-05-07 LAB — URINALYSIS, ROUTINE W REFLEX MICROSCOPIC
Bilirubin Urine: NEGATIVE
Glucose, UA: NEGATIVE mg/dL
Hgb urine dipstick: NEGATIVE
Ketones, ur: NEGATIVE mg/dL
Leukocytes,Ua: NEGATIVE
Nitrite: NEGATIVE
Protein, ur: NEGATIVE mg/dL
Specific Gravity, Urine: 1.004 — ABNORMAL LOW (ref 1.005–1.030)
pH: 8 (ref 5.0–8.0)

## 2022-05-07 LAB — CBC WITH DIFFERENTIAL/PLATELET
Abs Immature Granulocytes: 0.02 10*3/uL (ref 0.00–0.07)
Basophils Absolute: 0 10*3/uL (ref 0.0–0.1)
Basophils Relative: 0 %
Eosinophils Absolute: 0 10*3/uL (ref 0.0–0.5)
Eosinophils Relative: 0 %
HCT: 39.6 % (ref 36.0–46.0)
Hemoglobin: 12.2 g/dL (ref 12.0–15.0)
Immature Granulocytes: 0 %
Lymphocytes Relative: 16 %
Lymphs Abs: 1.1 10*3/uL (ref 0.7–4.0)
MCH: 20.1 pg — ABNORMAL LOW (ref 26.0–34.0)
MCHC: 30.8 g/dL (ref 30.0–36.0)
MCV: 65.2 fL — ABNORMAL LOW (ref 80.0–100.0)
Monocytes Absolute: 0.2 10*3/uL (ref 0.1–1.0)
Monocytes Relative: 3 %
Neutro Abs: 5.4 10*3/uL (ref 1.7–7.7)
Neutrophils Relative %: 81 %
Platelets: 276 10*3/uL (ref 150–400)
RBC: 6.07 MIL/uL — ABNORMAL HIGH (ref 3.87–5.11)
RDW: 15.4 % (ref 11.5–15.5)
WBC: 6.7 10*3/uL (ref 4.0–10.5)
nRBC: 0 % (ref 0.0–0.2)

## 2022-05-07 MED ORDER — METHOCARBAMOL 1000 MG/10ML IJ SOLN
500.0000 mg | Freq: Four times a day (QID) | INTRAVENOUS | Status: DC | PRN
  Administered 2022-05-08: 500 mg via INTRAVENOUS
  Filled 2022-05-07 (×2): qty 5
  Filled 2022-05-07: qty 500

## 2022-05-07 MED ORDER — FENTANYL CITRATE PF 50 MCG/ML IJ SOSY
50.0000 ug | PREFILLED_SYRINGE | Freq: Once | INTRAMUSCULAR | Status: AC
  Administered 2022-05-07: 50 ug via INTRAMUSCULAR
  Filled 2022-05-07: qty 1

## 2022-05-07 MED ORDER — HYDROMORPHONE HCL 1 MG/ML IJ SOLN
0.5000 mg | Freq: Once | INTRAMUSCULAR | Status: AC
  Administered 2022-05-07: 0.5 mg via INTRAVENOUS
  Filled 2022-05-07: qty 1

## 2022-05-07 MED ORDER — PREDNISONE 20 MG PO TABS
40.0000 mg | ORAL_TABLET | Freq: Every day | ORAL | Status: DC
  Administered 2022-05-08: 40 mg via ORAL
  Filled 2022-05-07: qty 2

## 2022-05-07 MED ORDER — TAMSULOSIN HCL 0.4 MG PO CAPS
0.4000 mg | ORAL_CAPSULE | Freq: Every day | ORAL | Status: DC
  Administered 2022-05-07 – 2022-05-08 (×2): 0.4 mg via ORAL
  Filled 2022-05-07 (×2): qty 1

## 2022-05-07 MED ORDER — LORAZEPAM 2 MG/ML IJ SOLN
0.5000 mg | Freq: Once | INTRAMUSCULAR | Status: AC | PRN
  Administered 2022-05-07: 0.5 mg via INTRAVENOUS
  Filled 2022-05-07: qty 1

## 2022-05-07 MED ORDER — ACETAMINOPHEN 650 MG RE SUPP: 650.0000 mg | Freq: Four times a day (QID) | RECTAL | Status: DC | PRN

## 2022-05-07 MED ORDER — TRAMADOL HCL 50 MG PO TABS
50.0000 mg | ORAL_TABLET | Freq: Four times a day (QID) | ORAL | Status: DC | PRN
  Administered 2022-05-08: 50 mg via ORAL
  Filled 2022-05-07: qty 1

## 2022-05-07 MED ORDER — ACETAMINOPHEN 325 MG PO TABS
650.0000 mg | ORAL_TABLET | Freq: Four times a day (QID) | ORAL | Status: DC | PRN
  Administered 2022-05-08: 650 mg via ORAL
  Filled 2022-05-07: qty 2

## 2022-05-07 MED ORDER — LORAZEPAM 2 MG/ML IJ SOLN
0.5000 mg | Freq: Once | INTRAMUSCULAR | Status: AC
  Administered 2022-05-07: 0.5 mg via INTRAVENOUS
  Filled 2022-05-07: qty 1

## 2022-05-07 MED ORDER — SODIUM CHLORIDE 0.9 % IV SOLN: INTRAVENOUS | Status: AC

## 2022-05-07 MED ORDER — INFLUENZA VAC SPLIT QUAD 0.5 ML IM SUSY: 0.5000 mL | PREFILLED_SYRINGE | INTRAMUSCULAR | Status: DC

## 2022-05-07 MED ORDER — METHYLPREDNISOLONE SODIUM SUCC 125 MG IJ SOLR
125.0000 mg | Freq: Once | INTRAMUSCULAR | Status: AC
  Administered 2022-05-07: 125 mg via INTRAVENOUS
  Filled 2022-05-07: qty 2

## 2022-05-07 MED ORDER — HYDROCODONE-ACETAMINOPHEN 5-325 MG PO TABS
1.0000 | ORAL_TABLET | ORAL | Status: DC | PRN
  Administered 2022-05-07 – 2022-05-08 (×2): 2 via ORAL
  Administered 2022-05-08 – 2022-05-09 (×4): 1 via ORAL
  Administered 2022-05-10: 2 via ORAL
  Filled 2022-05-07 (×3): qty 2
  Filled 2022-05-07: qty 1
  Filled 2022-05-07: qty 2
  Filled 2022-05-07 (×2): qty 1

## 2022-05-07 MED ORDER — HYDROMORPHONE HCL 1 MG/ML IJ SOLN: 0.5000 mg | INTRAMUSCULAR | Status: DC | PRN

## 2022-05-07 NOTE — ED Triage Notes (Signed)
Pt coming from home via EMS with c/o back pain and urinary retention since this morning. Hx back pain.   115 HR  132/92 BP 99% spO2 RA

## 2022-05-07 NOTE — ED Provider Notes (Signed)
[facility] DEPT Provider Note   CSN: [id number] Arrival date & time: 05/07/22  1239     History Chief Complaint  Patient presents with   Back Pain   Urinary Retention    Jill Swanson is a 51 y.o. female with history of chronic back pain presents the emergency department for evaluation of exacerbation of her pain.  Patient reports that she has had chronic back pain for years however is well managed with her physical therapy and daily exercises.  This morning around 0630, the patient reports that she went to bend down to pick up her pants after using the bathroom when she felt a sudden onset of sharp lower midline pain.  She reports that this radiated throughout her back and lower buttocks.  She reports that she went to lay in bed to see if she was going to feel better.  She was drinking lots of water, to stay well-hydrated.  She took a Flexeril, but reports it did not help her pain any.  She reports that she tried to urinate around 0900 when she felt an urge however was only able to urinate a very small amount, enough to just wet an incontinence pad.  She reports that around 11, she was having significant lower abdominal pain as she was not able to urinate.  Given this pain and her inability to urinate, she decided to proceed to the emergency department.  She denies any numbness or tingling into her legs.  Pain is worse with movement.  She denies any fevers, IV drug use, saddle anesthesia, fecal or urinary incontinence.  Denies any known trauma to the area recently.   Back Pain Associated symptoms: no chest pain, no dysuria, no fever and no numbness        Home Medications Prior to Admission medications   Not on File      Allergies    Ibuprofen and Penicillins    Review of Systems   Review of Systems  Constitutional:  Negative for chills and fever.  Respiratory:  Negative for shortness of breath.   Cardiovascular:  Negative for chest pain.   Gastrointestinal:  Negative for abdominal distention, nausea and vomiting.  Genitourinary:  Negative for dysuria and hematuria.       Reports urinary retention  Musculoskeletal:  Positive for back pain.  Neurological:  Negative for numbness.    Physical Exam Updated Vital Signs BP (!) 132/92 (BP Location: Left Arm)   Pulse (!) 122   Temp 97.8 F (36.6 C) (Oral)   Resp 18   SpO2 100%  Physical Exam Vitals and nursing note reviewed.  Constitutional:      Appearance: Normal appearance. She is not toxic-appearing.     Comments: Uncomfortable appearing, but nontoxic  Eyes:     General: No scleral icterus. Cardiovascular:     Rate and Rhythm: Tachycardia present.  Pulmonary:     Effort: Pulmonary effort is normal. No respiratory distress.  Abdominal:     Palpations: Abdomen is soft.     Tenderness: There is no abdominal tenderness. There is no guarding or rebound.  Musculoskeletal:       Arms:     Comments: Unable to view patient's skin as she is in a hallway bed.  I do not feel any step-offs or deformities.  She does not have any midline or paraspinal tenderness to her cervical, basilar, or lumbar spine.  She does have pain on movement of her lower legs to her back  with and without resistance.  She reports that it hurts more on the left than it does on the right.  Sensation intact through her lower and upper extremities.  She can dorsiflex and plantarflex her feet bilaterally.  Compartments are soft.  Temperature and sensation appear and feel symmetric.  Area marked for her reported pain is however it is nontender to palpation.  Skin:    General: Skin is dry.     Findings: No rash.  Neurological:     General: No focal deficit present.     Mental Status: She is alert. Mental status is at baseline.  Psychiatric:        Mood and Affect: Mood normal.     ED Results / Procedures / Treatments   Labs (all labs ordered are listed, but only abnormal results are displayed) Labs  Reviewed - No data to display  EKG None  Radiology No results found.  Procedures Procedures   Medications Ordered in ED Medications  HYDROmorphone (DILAUDID) injection 0.5 mg (has no administration in time range)  LORazepam (ATIVAN) injection 0.5 mg (has no administration in time range)  fentaNYL (SUBLIMAZE) injection 50 mcg (50 mcg Intramuscular Given 05/07/22 1307)    ED Course/ Medical Decision Making/ A&P Clinical Course as of 05/07/22 1518  Wed May 07, 2022  1516 Cauda equina r/o  [CR]    Clinical Course User Index [CR] Peter Garter, PA                           Medical Decision Making Amount and/or Complexity of Data Reviewed Labs: ordered. Radiology: ordered.  Risk Prescription drug management.   51 year old female presents emergency room for evaluation of lower back pain and urinary retention.  Differential diagnosis includes was limited to bladder outlet obstruction, cauda equina, UTI, low back pain, strain, sprain, epidural abscess, central cord syndrome.  Vital signs show mildly elevated blood pressure 132/92, afebrile, pulse patient is tachycardic at 122, likely due to pain, satting well on room air without any increased work of breathing.  Physical exam as noted above.  Nursing alerted the patient's bladder had almost 900 mL of urine.  Will order Foley catheter placement.  Given the patient's lower back pain with urinary retention, I do think it is valuable to get an MRI with and without contrast of the lumbar spine to rule out any cauda equina.  We will order basic labs as well.  Patient was initially given fentanyl for pain and then Dilaudid.  Will handoff to oncoming shift for lab follow-up and MRI follow-up.  3:18 PM Care of VENETIA PREWITT transferred to [facility] at the end of my shift as the patient will require reassessment once labs/imaging have resulted. Patient presentation, ED course, and plan of care discussed with review of all  pertinent labs and imaging. Please see his/her note for further details regarding further ED course and disposition. Plan at time of handoff is follow up on labs and imaging for r/o cauda equina. This may be altered or completely changed at the discretion of the oncoming team pending results of further workup.   Final Clinical Impression(s) / ED Diagnoses Final diagnoses:  None    Rx / DC Orders ED Discharge Orders     None         Achille Rich, PA-C 05/07/22 1520    Cathren Laine, MD 05/08/22 [phone]

## 2022-05-07 NOTE — Assessment & Plan Note (Signed)
Foley in place, pain control  start flomax  if no improvement will need follow up with Urology

## 2022-05-07 NOTE — ED Provider Notes (Signed)
Physical Exam  BP 129/83   Pulse 92   Temp 98.6 F (37 C) (Oral)   Resp 12   SpO2 99%   Physical Exam Vitals and nursing note reviewed.  Constitutional:      General: She is not in acute distress.    Appearance: She is well-developed.  HENT:     Head: Normocephalic and atraumatic.  Eyes:     Conjunctiva/sclera: Conjunctivae normal.  Cardiovascular:     Rate and Rhythm: Normal rate and regular rhythm.     Heart sounds: No murmur heard. Pulmonary:     Effort: Pulmonary effort is normal. No respiratory distress.     Breath sounds: Normal breath sounds.  Abdominal:     Palpations: Abdomen is soft.     Tenderness: There is no abdominal tenderness.  Musculoskeletal:        General: No swelling.     Cervical back: Neck supple.  Skin:    General: Skin is warm and dry.     Capillary Refill: Capillary refill takes less than 2 seconds.  Neurological:     Mental Status: She is alert.  Psychiatric:        Mood and Affect: Mood normal.     Procedures  Procedures  ED Course / MDM   Clinical Course as of 05/07/22 2219  Wed May 07, 2022  [facility] hospital medicine Dr. Roel Cluck regarding patient.  She agreed with admission the patient is seen further treatment/care. [CR]    Clinical Course User Index [CR] Wilnette Kales, PA   Medical Decision Making Amount and/or Complexity of Data Reviewed Labs: ordered. Radiology: ordered.  Risk Prescription drug management. Decision regarding hospitalization.   At shift change, patient care handed from [facility], [place].  Treatment plan is to follow-up on MRI of back for assessment of cauda equina.  In short, patient history of chronic back pain.  This morning around 630, patient bent over to pick something up and felt sharp back pain.  Since then, has had symptoms of urinary retention as well as intense pain not relieved by her at home medicines.  Denies weakness/sensory deficit in lower extremities, bowel dysfunction,  history of IV drug use, fever, urinary incontinence, any recent trauma.  Laboratory studies: No leukocytosis.  No evidence anemia.  Platelets within range.  No electrolyte abnormalities.  No transaminitis noted.  No evidence of renal dysfunction.  UA without abnormalities.  Imaging studies: MRI lumbar spine: No high-grade spinal canal or neural foraminal stenosis.  Small central disc protrusion at L4-L5 with mild right lateral recess narrowing. CT renal stone study: No acute intra-abdominal pathology  Urinary retention/back pain Vital signs within normal ranges stable throughout patient Laboratory/imaging studies significant for: See above Patient deemed to meet admission criteria given the lack of pain control with administration of Dilaudid while in the emergency department as well as inability of family to take care of patient given that she is nonambulatory.  Patient also has indwelling Foley catheter of which she also states she is going to have difficulty managing at home.  Given host of symptoms mentioned above, and admission to the hospital deemed most appropriate.  Hospital medicine was consulted regarding the patient agreed with admission of the patient.  Patient was started on IV steroids.  Treatment plan discussed length with patient and she acknowledged understand was agreeable to said plan.  Patient was stable upon admission to the hospital.       Wilnette Kales, [place] 05/07/22 2219  Regan Lemming, MD 05/08/22 1250

## 2022-05-07 NOTE — Assessment & Plan Note (Addendum)
MRI not showing any evidence of cauda equina PT/OT therapy  was started on steroids will continue for now

## 2022-05-07 NOTE — ED Notes (Signed)
Patient transported to MRI 

## 2022-05-07 NOTE — H&P (Signed)
Jill Swanson YIF:[id number] DOB: 04-Mar-1971 DOA: 05/07/2022     PCP: Wilfrid Lund, PA      Patient arrived to ER on 05/07/22 at 1239 Referred by Attending Virgina Norfolk, DO   Patient coming from:    home Lives With family    Chief Complaint:   Chief Complaint  Patient presents with   Back Pain   Urinary Retention    HPI: Jill Swanson is a 51 y.o. female with medical history significant of chronic back pain  Presented with back pain and urinary root tension Patient coming from home complaining of back packing urinary retention She has known history of chronic back pain but this morning she bent over to pick something and felt severe pain which was a change.  Since then she has not been able to urinate and has intense pain not relieved by her home medications.  Otherwise no weakness or numbness in her lower extremity no bowel dysfunction No otherwise recent trauma Pain radiating to her lower buttocks she try to take Flexeril but did not help.  She tried to urinate around 9 AM but only small amount came out By 11 AM she had significant lower abdominal discomfort but could not urinate at all at this point she came to emergency department.  Does not smoke drinks rarely  While in ER: Clinical Course as of 05/07/22 1939  Wed May 07, 2022  [facility] hospital medicine Dr. Adela Glimpse regarding patient.  She agreed with admission the patient is seen further treatment/care. [CR]    Clinical Course User Index [CR] Peter Garter, PA   Noted to be significantly tachycardic up to 122 Bladder scan showed 900 mL of urine Foley placed MRI ordered  MRI done showingNo high-grade spinal canal or neural foraminal stenosis at any level. 2. Small central disc protrusion at L4-L5 with mild right lateral recess narrowing.   Pain not controlled with dilaudid 2 doses of 0.5 mg No relief   Was given steroids too Unable to ambulate  CTabd/pelvis -  non acute   Following  Medications were ordered in ER: Medications  fentaNYL (SUBLIMAZE) injection 50 mcg (50 mcg Intramuscular Given 05/07/22 1307)  HYDROmorphone (DILAUDID) injection 0.5 mg (0.5 mg Intravenous Given 05/07/22 1439)  LORazepam (ATIVAN) injection 0.5 mg (0.5 mg Intravenous Given 05/07/22 1516)  methylPREDNISolone sodium succinate (SOLU-MEDROL) 125 mg/2 mL injection 125 mg (125 mg Intravenous Given 05/07/22 1751)  HYDROmorphone (DILAUDID) injection 0.5 mg (0.5 mg Intravenous Given 05/07/22 1750)  LORazepam (ATIVAN) injection 0.5 mg (0.5 mg Intravenous Given 05/07/22 1750)    ______________    ED Triage Vitals  Enc Vitals Group     BP 05/07/22 1250 (!) 132/92     Pulse Rate 05/07/22 1250 (!) 122     Resp 05/07/22 1250 18     Temp 05/07/22 1250 97.8 F (36.6 C)     Temp Source 05/07/22 1250 Oral     SpO2 05/07/22 1250 100 %     Weight --      Height --      Head Circumference --      Peak Flow --      Pain Score 05/07/22 1252 10     Pain Loc --      Pain Edu? --      Excl. in GC? --   TMAX(24)@     _________________________________________ Significant initial  Findings: Abnormal Labs Reviewed  URINALYSIS, ROUTINE W REFLEX MICROSCOPIC - Abnormal; Notable for  the following components:      Result Value   Color, Urine STRAW (*)    Specific Gravity, Urine 1.004 (*)    All other components within normal limits  CBC WITH DIFFERENTIAL/PLATELET - Abnormal; Notable for the following components:   RBC 6.07 (*)    MCV 65.2 (*)    MCH 20.1 (*)    All other components within normal limits  COMPREHENSIVE METABOLIC PANEL - Abnormal; Notable for the following components:   Total Bilirubin 2.4 (*)    All other components within normal limits     ECG: Ordered Personally reviewed and interpreted by me showing: HR : 97 Rhythm: Sinus rhythm Prolonged PR interval Right atrial enlargement Borderline left axis deviation RSR' in V1 or V2, probably normal variant QTC 435    The recent clinical  data is shown below. Vitals:   05/07/22 1833 05/07/22 1834 05/07/22 1845 05/07/22 1900  BP: 128/78  129/72 121/82  Pulse: 93 96 100 91  Resp: 16 14 13 13   Temp:      TempSrc:      SpO2: (!) 88% 96% 100% 96%    WBC     Component Value Date/Time   WBC 6.7 05/07/2022 1417   LYMPHSABS PENDING 05/07/2022 1417   LYMPHSABS 2.1 01/28/2018 1245   MONOABS PENDING 05/07/2022 1417   EOSABS PENDING 05/07/2022 1417   EOSABS 0.1 01/28/2018 1245   BASOSABS PENDING 05/07/2022 1417   BASOSABS 0.0 01/28/2018 1245      UA   no evidence of UTI      Urine analysis:    Component Value Date/Time   COLORURINE STRAW (A) 05/07/2022 1428   APPEARANCEUR CLEAR 05/07/2022 1428   LABSPEC 1.004 (L) 05/07/2022 1428   PHURINE 8.0 05/07/2022 1428   GLUCOSEU NEGATIVE 05/07/2022 1428   HGBUR NEGATIVE 05/07/2022 1428   BILIRUBINUR NEGATIVE 05/07/2022 1428   BILIRUBINUR negative 01/28/2018 1137   KETONESUR NEGATIVE 05/07/2022 1428   PROTEINUR NEGATIVE 05/07/2022 1428   UROBILINOGEN 0.2 01/28/2018 1137   NITRITE NEGATIVE 05/07/2022 1428   LEUKOCYTESUR NEGATIVE 05/07/2022 1428    Results for orders placed or performed in visit on 01/28/18  Urine Culture     Status: None   Collection Time: 01/28/18  4:07 PM   Specimen: Urine   UR  Result Value Ref Range Status   Urine Culture, Routine Final report  Final   Organism ID, Bacteria Comment  Final    Comment: Culture shows less than 10,000 colony forming units of bacteria per milliliter of urine. This colony count is not generally considered to be clinically significant.      _______________________________________________ Hospitalist was called for admission for   Urinary retention  Back pain    The following Work up has been ordered so far:  Orders Placed This Encounter  Procedures   MR LUMBAR SPINE WO CONTRAST   CT Renal Stone Study   Urinalysis, Routine w reflex microscopic   CBC with Differential   Comprehensive metabolic panel   Insert  foley catheter   Consult to hospitalist     OTHER Significant initial  Findings:  labs showing:    Recent Labs  Lab 05/07/22 1417  NA 143  K 3.9  CO2 23  GLUCOSE 99  BUN 11  CREATININE 0.62  CALCIUM 9.5    Cr  stable,   Lab Results  Component Value Date   CREATININE 0.62 05/07/2022   CREATININE 0.67 02/09/2021   CREATININE 0.74 01/28/2018  Recent Labs  Lab 05/07/22 1417  AST 21  ALT 25  ALKPHOS 72  BILITOT 2.4*  PROT 7.6  ALBUMIN 4.5   Lab Results  Component Value Date   CALCIUM 9.5 05/07/2022       Plt: Lab Results  Component Value Date   PLT 276 05/07/2022        Recent Labs  Lab 05/07/22 1417  WBC 6.7  NEUTROABS PENDING  HGB 12.2  HCT 39.6  MCV 65.2*  PLT 276    HG/HCT   stable,       Component Value Date/Time   HGB 12.2 05/07/2022 1417   HGB 10.7 (L) 01/28/2018 1245   HCT 39.6 05/07/2022 1417   HCT 34.6 01/28/2018 1245   MCV 65.2 (L) 05/07/2022 1417   MCV 65 (L) 01/28/2018 1245         Cultures: No results found for: "SDES", "SPECREQUEST", "CULT", "REPTSTATUS"   Radiological Exams on Admission: CT Renal Stone Study  Result Date: 05/07/2022 CLINICAL DATA:  urinary retention EXAM: CT ABDOMEN AND PELVIS WITHOUT CONTRAST TECHNIQUE: Multidetector CT imaging of the abdomen and pelvis was performed following the standard protocol without IV contrast. RADIATION DOSE REDUCTION: This exam was performed according to the departmental dose-optimization program which includes automated exposure control, adjustment of the mA and/or kV according to patient size and/or use of iterative reconstruction technique. COMPARISON:  None Available. FINDINGS: Lower chest: No acute abnormality. Hepatobiliary: No focal liver abnormality. No gallstones, gallbladder wall thickening, or pericholecystic fluid. No biliary dilatation. Pancreas: No focal lesion. Normal pancreatic contour. No surrounding inflammatory changes. No main pancreatic ductal dilatation.  Spleen: Normal in size without focal abnormality. Adrenals/Urinary Tract: No adrenal nodule bilaterally. No nephrolithiasis and no hydronephrosis. No definite contour-deforming renal mass. No ureterolithiasis or hydroureter. The urinary bladder is decompressed with tip and inflated balloon within the urinary bladder lumen. Stomach/Bowel: Stomach is within normal limits. No evidence of bowel wall thickening or dilatation. Appendix appears normal. Appendicoliths noted within the lumen. Vascular/Lymphatic: No abdominal aorta or iliac aneurysm. No abdominal, pelvic, or inguinal lymphadenopathy. Reproductive: Uterus and bilateral adnexa are unremarkable. Other: No intraperitoneal free fluid. No intraperitoneal free gas. No organized fluid collection. Musculoskeletal: No abdominal wall hernia or abnormality. No suspicious lytic or blastic osseous lesions. No acute displaced fracture. Multilevel degenerative changes of the spine. IMPRESSION: No acute intra-abdominal or intrapelvic abnormality. Electronically Signed   By: Tish FredericksonMorgane  Naveau M.D.   On: 05/07/2022 18:37   MR LUMBAR SPINE WO CONTRAST  Result Date: 05/07/2022 CLINICAL DATA:  Cauda equina syndrome suspected. EXAM: MRI LUMBAR SPINE WITHOUT CONTRAST TECHNIQUE: Multiplanar, multisequence MR imaging of the lumbar spine was performed. No intravenous contrast was administered. COMPARISON:  None Available. FINDINGS: Segmentation:  Standard. Alignment:  Physiologic. Vertebrae:  No fracture, evidence of discitis, or bone lesion. Conus medullaris and cauda equina: Conus extends to the L1 level. Conus and cauda equina appear normal. Paraspinal and other soft tissues: Negative. Disc levels: T11-T12: Only imaged in the sagittal plane. No evidence of spinal canal or neural foraminal stenosis. T12-L1: No disc bulge. No significant facet degenerative change. No spinal canal stenosis. No neural foraminal stenosis. L1-L2: No disc bulge. No significant facet degenerative change.  No spinal canal stenosis. No neural foraminal stenosis. L2-L3: No disc bulge. No significant facet degenerative change. No spinal canal stenosis. No neural foraminal stenosis. L3-L4: No disc bulge. No significant facet degenerative change. No spinal canal stenosis. No neural foraminal stenosis. L4-L5: Small central disc protrusion. No overall spinal  canal stenosis. Mild right lateral recess narrowing. No neural foraminal stenosis. L5-S1: No disc bulge. No significant facet degenerative change. No spinal canal stenosis. No neural foraminal stenosis. IMPRESSION: 1. No high-grade spinal canal or neural foraminal stenosis at any level. 2. Small central disc protrusion at L4-L5 with mild right lateral recess narrowing. Electronically Signed   By: Lorenza Cambridge M.D.   On: 05/07/2022 15:42   _______________________________________________________________________________________________________ Latest  Blood pressure 121/82, pulse 91, temperature 98.6 F (37 C), temperature source Oral, resp. rate 13, SpO2 96 %.   Vitals  labs and radiology finding personally reviewed  Review of Systems:    Pertinent positives include:  back pain.   Constitutional:  No weight loss, night sweats, Fevers, chills, fatigue, weight loss  HEENT:  No headaches, Difficulty swallowing,Tooth/dental problems,Sore throat,  No sneezing, itching, ear ache, nasal congestion, post nasal drip,  Cardio-vascular:  No chest pain, Orthopnea, PND, anasarca, dizziness, palpitations.no Bilateral lower extremity swelling  GI:  No heartburn, indigestion, abdominal pain, nausea, vomiting, diarrhea, change in bowel habits, loss of appetite, melena, blood in stool, hematemesis Resp:  no shortness of breath at rest. No dyspnea on exertion, No excess mucus, no productive cough, No non-productive cough, No coughing up of blood.No change in color of mucus.No wheezing. Skin:  no rash or lesions. No jaundice GU:  no dysuria, change in color of urine,  no urgency or frequency. No straining to urinate.  No flank pain.  Musculoskeletal:  No joint pain or no joint swelling. No decreased range of motion. No  Psych:  No change in mood or affect. No depression or anxiety. No memory loss.  Neuro: no localizing neurological complaints, no tingling, no weakness, no double vision, no gait abnormality, no slurred speech, no confusion  All systems reviewed and apart from HOPI all are negative _______________________________________________________________________________________________ Past Medical History:   Past Medical History:  Diagnosis Date   Allergy    Anemia    Depression    Heart murmur       Past Surgical History:  Procedure Laterality Date   lipoma removal on neck in Jan 2019      Social History:  Ambulatory   independently       reports that she quit smoking about 32 years ago. Her smoking use included cigarettes. She has never used smokeless tobacco. She reports that she does not currently use alcohol. She reports that she does not use drugs.     Family History:   Family History  Problem Relation Age of Onset   Diabetes Mother    Hyperlipidemia Mother    Hypertension Mother    Heart disease Maternal Grandmother    Cancer Maternal Grandfather        Bone cancer   Heart disease Paternal Grandfather    ______________________________________________________________________________________________ Allergies: Allergies  Allergen Reactions   Penicillins Hives, Itching and Rash    Medicinal Product Containing Penicillin And Acting As Antibacterial Agent (product)   Ibuprofen Other (See Comments)    unk     Prior to Admission medications   Medication Sig Start Date End Date Taking? Authorizing Provider  acetaminophen (TYLENOL) 500 MG tablet Tylenol Extra Strength 500 mg tablet   Yes [provider]  cyclobenzaprine (FLEXERIL) 5 MG tablet Take 5 mg by mouth 3 (three) times daily as needed for muscle  spasms.   Yes [provider]    ___________________________________________________________________________________________________ Physical Exam:    05/07/2022    7:00 PM 05/07/2022    6:45 PM  05/07/2022    6:34 PM  Vitals with BMI  Systolic 500 938   Diastolic 82 72   Pulse 91 100 96     1. General:  in No  Acute distress   Chronically ill   -appearing 2. Psychological: Alert and   Oriented 3. Head/ENT:    Dry Mucous Membranes                          Head Non traumatic, neck supple                           Poor Dentition 4. SKIN:  decreased Skin turgor,  Skin clean Dry and intact no rash 5. Heart: Regular rate and rhythm no  Murmur, no Rub or gallop 6. Lungs: , no wheezes or crackles   7. Abdomen: Soft,  non-tender, Non distended   obese  bowel sounds present 8. Lower extremities: no clubbing, cyanosis, no  edema 9. Neurologically  strength 5 out of 5 in all 4 extremities cranial nerves II through XII intact 10. MSK: Normal range of motion    Chart has been reviewed  ______________________________________________________________________________________________  Assessment/Plan 51 y.o. female with medical history significant of chronic back pain   Admitted for   Urinary retention, back pain     Present on Admission:  Urinary retention  Back pain     Urinary retention Foley in place, pain control  start flomax  if no improvement will need follow up with Urology  Back pain MRI not showing any evidence of cauda equina PT/OT therapy  was started on steroids will continue for now   Other plan as per orders.  DVT prophylaxis:  SCD     Code Status:    Code Status: Not on file FULL CODE   as per patient   I had personally discussed CODE STATUS with patient     Family Communication:   Family not at  Bedside    Disposition Plan:   To home once workup is complete and patient is stable   Following barriers for discharge:     Pain controlled with PO  medications    Would benefit from PT/OT eval prior to DC  Ordered                     Consults called: none   Wound benefit from spine clinic follow up Admission status:  ED Disposition     ED Disposition  [place]: [facility] [[id number]]  Level of Care: Med-Surg [16]  May place patient in observation at [facility] or [place] if equivalent level of care is available:: No  Covid Evaluation: Asymptomatic - no recent exposure (last 10 days) testing not required  Diagnosis: Urinary retention [[id number]]  Admitting Physician: Toy Baker [[id number]]  Attending Physician: Toy Baker [[id number]]          Obs      Level of care    medical floor   Toy Baker 05/07/2022, 8:22 PM    [facility]     after 2 AM please page floor coverage PA If 7AM-7PM, please contact the day team taking care of the patient using [email or web address]   Patient was evaluated in the context of the global COVID-19 pandemic, which necessitated consideration that the patient might be at risk for infection with the SARS-CoV-2  virus that causes COVID-19. Institutional protocols and algorithms that pertain to the evaluation of patients at risk for COVID-19 are in a state of rapid change based on information released by regulatory bodies including the CDC and federal and state organizations. These policies and algorithms were followed during the patient's care.

## 2022-05-07 NOTE — Subjective & Objective (Signed)
Patient coming from home complaining of back packing urinary retention She has known history of chronic back pain but this morning she bent over to pick something and felt severe pain which was a change.  Since then she has not been able to urinate and has intense pain not relieved by her home medications.  Otherwise no weakness or numbness in her lower extremity no bowel dysfunction No otherwise recent trauma

## 2022-05-08 ENCOUNTER — Encounter (HOSPITAL_COMMUNITY): Payer: Self-pay | Admitting: Internal Medicine

## 2022-05-08 DIAGNOSIS — E669 Obesity, unspecified: Secondary | ICD-10-CM | POA: Diagnosis present

## 2022-05-08 DIAGNOSIS — Z978 Presence of other specified devices: Secondary | ICD-10-CM | POA: Diagnosis not present

## 2022-05-08 DIAGNOSIS — Z880 Allergy status to penicillin: Secondary | ICD-10-CM | POA: Diagnosis not present

## 2022-05-08 DIAGNOSIS — Z83438 Family history of other disorder of lipoprotein metabolism and other lipidemia: Secondary | ICD-10-CM | POA: Diagnosis not present

## 2022-05-08 DIAGNOSIS — R338 Other retention of urine: Secondary | ICD-10-CM | POA: Diagnosis present

## 2022-05-08 DIAGNOSIS — R000 Tachycardia, unspecified: Secondary | ICD-10-CM | POA: Diagnosis present

## 2022-05-08 DIAGNOSIS — E860 Dehydration: Secondary | ICD-10-CM | POA: Diagnosis present

## 2022-05-08 DIAGNOSIS — Z886 Allergy status to analgesic agent status: Secondary | ICD-10-CM | POA: Diagnosis not present

## 2022-05-08 DIAGNOSIS — K5900 Constipation, unspecified: Secondary | ICD-10-CM | POA: Diagnosis present

## 2022-05-08 DIAGNOSIS — Z833 Family history of diabetes mellitus: Secondary | ICD-10-CM | POA: Diagnosis not present

## 2022-05-08 DIAGNOSIS — D509 Iron deficiency anemia, unspecified: Secondary | ICD-10-CM | POA: Diagnosis present

## 2022-05-08 DIAGNOSIS — M5126 Other intervertebral disc displacement, lumbar region: Secondary | ICD-10-CM | POA: Diagnosis present

## 2022-05-08 DIAGNOSIS — Z87891 Personal history of nicotine dependence: Secondary | ICD-10-CM | POA: Diagnosis not present

## 2022-05-08 DIAGNOSIS — M62830 Muscle spasm of back: Secondary | ICD-10-CM | POA: Diagnosis present

## 2022-05-08 DIAGNOSIS — Z6834 Body mass index (BMI) 34.0-34.9, adult: Secondary | ICD-10-CM | POA: Diagnosis not present

## 2022-05-08 DIAGNOSIS — R066 Hiccough: Secondary | ICD-10-CM | POA: Diagnosis present

## 2022-05-08 DIAGNOSIS — M5441 Lumbago with sciatica, right side: Secondary | ICD-10-CM | POA: Diagnosis not present

## 2022-05-08 DIAGNOSIS — E872 Acidosis, unspecified: Secondary | ICD-10-CM | POA: Diagnosis present

## 2022-05-08 DIAGNOSIS — M5442 Lumbago with sciatica, left side: Secondary | ICD-10-CM | POA: Diagnosis not present

## 2022-05-08 DIAGNOSIS — E538 Deficiency of other specified B group vitamins: Secondary | ICD-10-CM | POA: Diagnosis not present

## 2022-05-08 DIAGNOSIS — Z8249 Family history of ischemic heart disease and other diseases of the circulatory system: Secondary | ICD-10-CM | POA: Diagnosis not present

## 2022-05-08 DIAGNOSIS — G8929 Other chronic pain: Secondary | ICD-10-CM | POA: Diagnosis present

## 2022-05-08 DIAGNOSIS — D569 Thalassemia, unspecified: Secondary | ICD-10-CM | POA: Diagnosis present

## 2022-05-08 DIAGNOSIS — R339 Retention of urine, unspecified: Secondary | ICD-10-CM | POA: Diagnosis present

## 2022-05-08 DIAGNOSIS — Z79899 Other long term (current) drug therapy: Secondary | ICD-10-CM | POA: Diagnosis not present

## 2022-05-08 LAB — CBC
HCT: 36.7 % (ref 36.0–46.0)
Hemoglobin: 11.6 g/dL — ABNORMAL LOW (ref 12.0–15.0)
MCH: 20.4 pg — ABNORMAL LOW (ref 26.0–34.0)
MCHC: 31.6 g/dL (ref 30.0–36.0)
MCV: 64.4 fL — ABNORMAL LOW (ref 80.0–100.0)
Platelets: 272 10*3/uL (ref 150–400)
RBC: 5.7 MIL/uL — ABNORMAL HIGH (ref 3.87–5.11)
RDW: 15.1 % (ref 11.5–15.5)
WBC: 9.7 10*3/uL (ref 4.0–10.5)
nRBC: 0 % (ref 0.0–0.2)

## 2022-05-08 LAB — COMPREHENSIVE METABOLIC PANEL
ALT: 24 U/L (ref 0–44)
AST: 18 U/L (ref 15–41)
Albumin: 4.3 g/dL (ref 3.5–5.0)
Alkaline Phosphatase: 70 U/L (ref 38–126)
Anion gap: 9 (ref 5–15)
BUN: 15 mg/dL (ref 6–20)
CO2: 21 mmol/L — ABNORMAL LOW (ref 22–32)
Calcium: 9.5 mg/dL (ref 8.9–10.3)
Chloride: 106 mmol/L (ref 98–111)
Creatinine, Ser: 0.51 mg/dL (ref 0.44–1.00)
GFR, Estimated: >60 mL/min (ref >60)
Glucose, Bld: 172 mg/dL — ABNORMAL HIGH (ref 70–99)
Potassium: 4.1 mmol/L (ref 3.5–5.1)
Sodium: 136 mmol/L (ref 135–145)
Total Bilirubin: 2.1 mg/dL — ABNORMAL HIGH (ref 0.3–1.2)
Total Protein: 7.4 g/dL (ref 6.5–8.1)

## 2022-05-08 LAB — PREALBUMIN: Prealbumin: 24 mg/dL (ref 18–38)

## 2022-05-08 LAB — CK: Total CK: 65 U/L (ref 38–234)

## 2022-05-08 LAB — HIV ANTIBODY (ROUTINE TESTING W REFLEX): HIV Screen 4th Generation wRfx: NONREACTIVE

## 2022-05-08 LAB — PHOSPHORUS
Phosphorus: 3.6 mg/dL (ref 2.5–4.6)
Phosphorus: 3.6 mg/dL (ref 2.5–4.6)

## 2022-05-08 LAB — MAGNESIUM
Magnesium: 2 mg/dL (ref 1.7–2.4)
Magnesium: 2 mg/dL (ref 1.7–2.4)

## 2022-05-08 LAB — RETICULOCYTES
Immature Retic Fract: 13.5 % (ref 2.3–15.9)
RBC.: 6.14 MIL/uL — ABNORMAL HIGH (ref 3.87–5.11)
Retic Count, Absolute: 100.7 10*3/uL (ref 19.0–186.0)
Retic Ct Pct: 1.6 % (ref 0.4–3.1)

## 2022-05-08 LAB — TSH: TSH: 0.918 u[IU]/mL (ref 0.350–4.500)

## 2022-05-08 LAB — FERRITIN: Ferritin: 73 ng/mL (ref 11–307)

## 2022-05-08 MED ORDER — METHOCARBAMOL 500 MG PO TABS
500.0000 mg | ORAL_TABLET | Freq: Four times a day (QID) | ORAL | Status: DC | PRN
  Filled 2022-05-08: qty 1

## 2022-05-08 MED ORDER — POLYETHYLENE GLYCOL 3350 17 G PO PACK
17.0000 g | PACK | Freq: Every day | ORAL | Status: DC
  Administered 2022-05-09 – 2022-05-10 (×2): 17 g via ORAL
  Filled 2022-05-08 (×3): qty 1

## 2022-05-08 MED ORDER — ONDANSETRON HCL 4 MG/2ML IJ SOLN
4.0000 mg | Freq: Four times a day (QID) | INTRAMUSCULAR | Status: DC | PRN
  Administered 2022-05-08: 4 mg via INTRAVENOUS
  Filled 2022-05-08: qty 2

## 2022-05-08 MED ORDER — SODIUM CHLORIDE 0.9 % IV SOLN: INTRAVENOUS | Status: DC

## 2022-05-08 MED ORDER — CYCLOBENZAPRINE HCL 5 MG PO TABS
5.0000 mg | ORAL_TABLET | Freq: Three times a day (TID) | ORAL | Status: DC
  Administered 2022-05-08 – 2022-05-10 (×7): 10 mg via ORAL
  Filled 2022-05-08 (×7): qty 2

## 2022-05-08 NOTE — Evaluation (Signed)
Occupational Therapy Evaluation Patient Details Name: Jill Swanson MRN: [id number] DOB: 1971/04/28 Today's Date: 05/08/2022   History of Present Illness 51 y.o. female with medical history significant of chronic back pain    Admitted for   Urinary retention, back pain   Clinical Impression   [name] is a 51 year old woman who presents with back pain. ON evaluation she presents with decreased activity tolerance and only able to transfer to side of bed and stand to perform back extension exercises. Some of her ADLs are performed at bed level due to not being able to tolerate upright position due to pain. Performed back extension exercises to assist with reducing pain symptoms and introduced the idea of AE and compensatory strategies. Patient will benefit from skilled OT services while in hospital to improve deficits and learn compensatory strategies as needed in order to return to PLOF.       Recommendations for follow up therapy are one component of a multi-disciplinary discharge planning process, led by the attending physician.  Recommendations may be updated based on patient status, additional functional criteria and insurance authorization.   Follow Up Recommendations  Home health OT    Assistance Recommended at Discharge Intermittent Supervision/Assistance  Patient can return home with the following A little help with walking and/or transfers;A little help with bathing/dressing/bathroom;Assistance with cooking/housework;Help with stairs or ramp for entrance    Functional Status Assessment  Patient has had a recent decline in their functional status and demonstrates the ability to make significant improvements in function in a reasonable and predictable amount of time.  Equipment Recommendations  BSC/3in1    Recommendations for Other Services       Precautions / Restrictions Precautions Precautions: Back Precaution Comments: Patient demonstratd good ability to  maintain back precautions Restrictions Weight Bearing Restrictions: No      Mobility Bed Mobility Overal bed mobility: Needs Assistance Bed Mobility: Supine to Sit, Sit to Supine Rolling: Supervision Sidelying to sit: Supervision Supine to sit: Supervision Sit to supine: Supervision        Transfers Overall transfer level: Needs assistance Equipment used: Rolling walker (2 wheels) Transfers: Sit to/from Stand Sit to Stand: Min guard                  Balance Overall balance assessment: Mild deficits observed, not formally tested                                         ADL either performed or assessed with clinical judgement   ADL Overall ADL's : Needs assistance/impaired Eating/Feeding: Independent;Bed level   Grooming: Set up;Bed level   Upper Body Bathing: Moderate assistance;Bed level   Lower Body Bathing: Maximal assistance;Bed level   Upper Body Dressing : Minimal assistance;Sitting   Lower Body Dressing: Sit to/from stand;Maximal assistance   Toilet Transfer: Min guard;BSC/3in1;Rolling walker (2 wheels)   Toileting- Clothing Manipulation and Hygiene: Maximal assistance;Sit to/from stand       Functional mobility during ADLs: Min guard General ADL Comments: Supervision for bed mobility and min guard for standing at edge of bed     Vision Baseline Vision/History: 1 Wears glasses Ability to See in Adequate Light: 1 Impaired       Perception     Praxis      Pertinent Vitals/Pain Pain Assessment Pain Assessment: 0-10 Pain Score: 8  Pain Location: low back with  movement Pain Descriptors / Indicators: Spasm, Grimacing, Guarding Pain Intervention(s): Monitored during session, Premedicated before session     Hand Dominance Right   Extremity/Trunk Assessment Upper Extremity Assessment Upper Extremity Assessment: Overall WFL for tasks assessed   Lower Extremity Assessment Lower Extremity Assessment: Defer to PT  evaluation   Cervical / Trunk Assessment Cervical / Trunk Assessment: Normal   Communication Communication Communication: No difficulties   Cognition Arousal/Alertness: Awake/alert Behavior During Therapy: WFL for tasks assessed/performed Overall Cognitive Status: Within Functional Limits for tasks assessed                                       General Comments       Exercises Other Exercises Other Exercises: Back Extension in standing x 6   Shoulder Instructions      Home Living Family/patient expects to be discharged to:: Private residence Living Arrangements: Spouse/significant other;Children Available Help at Discharge: Family Type of Home: House       Home Layout: Two level;Able to live on main level with bedroom/bathroom;1/2 bath on main level Alternate Level Stairs-Number of Steps: flight             Home Equipment: Adaptive equipment Adaptive Equipment: Reacher        Prior Functioning/Environment Prior Level of Function : Independent/Modified Independent             Mobility Comments: walked without AD, is primary CG for 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 autism; no falls in past 6 months ADLs Comments: independent        OT Problem List: Pain;Decreased knowledge of precautions;Decreased knowledge of use of DME or AE      OT Treatment/Interventions: Self-care/ADL training;Patient/family education;Therapeutic activities;DME and/or AE instruction;Modalities    OT Goals(Current goals can be found in the care plan section) Acute Rehab OT Goals Patient Stated Goal: less pain OT Goal Formulation: With patient Time For Goal Achievement: 05/23/22 Potential to Achieve Goals: Good  OT Frequency: Min 2X/week    Co-evaluation              AM-PAC OT "6 Clicks" Daily Activity     Outcome Measure Help from another person eating meals?: None Help from another person taking care of personal grooming?: A Little Help from another person  toileting, which includes using toliet, bedpan, or urinal?: A Lot Help from another person bathing (including washing, rinsing, drying)?: A Lot Help from another person to put on and taking off regular upper body clothing?: A Little Help from another person to put on and taking off regular lower body clothing?: A Lot 6 Click Score: 16   End of Session Equipment Utilized During Treatment: Rolling walker (2 wheels) Nurse Communication: Mobility status  Activity Tolerance: Patient limited by pain Patient left: in bed;with call bell/phone within reach  OT Visit Diagnosis: Pain                Time: 1761-6073 OT Time Calculation (min): 32 min Charges:  OT General Charges $OT Visit: 1 Visit OT Evaluation $OT Eval Low Complexity: 1 Low OT Treatments $Therapeutic Exercise: 8-22 mins  Gustavo Lah, OTR/L Acute Care Rehab Services  Office [phone]   [name] 05/08/2022, 3:01 PM

## 2022-05-08 NOTE — Progress Notes (Signed)
[facility] Progress Note  Patient: Jill Swanson     QAS:[id number]  DOA: 05/07/2022   PCP: Wilfrid Lund, PA       Brief hospital course: This is a 51 year old female with chronic back pain who developed acute sudden onset of lower back pain on 10/25 after bending down.  The pain continued and eventually she realized that she was unable to urinate and she developed lower abdominal discomfort.  In the ED, a Foley catheter was placed and 900 cc of urine subsequently drained out which improved her abdominal discomfort.  She continued to have severe back pain. MRI of the lumbar spine was ordered and revealed only a small central disc protrusion at L4-L5 with mild right lateral recess narrowing. She received fentanyl, Dilaudid, IV Ativan, Flomax and dose of Solu-Medrol on 10/25  Subjective:  Continues to have severe back pain and feels like she will be unable to sit or stand. She had severe nausea this morning.  Assessment and Plan: Principal Problem:   Urinary retention -Would like to do voiding trial tomorrow once her back pain is better controlled - cont Flomax  Active Problems:   Back pain -Severe lumbosacral spasm bilaterally - Have ordered Flexeril and hydrocodone in addition to IV Dilaudid - she states IV Dilaudid helped but caused her to feel "bad" and is trying to avoid it -  I would like to give  Toradol but she is allergic to NSAIDs (Ibuprofen caused anaphylaxis)    Nausea and poor oral intake with severe hiccups and dehydration - She received a dose of IV Zofran this morning - Have ordered normal saline at 75 cc/h  Elevated T bili Metabolic acidosis - follow  Microcytic anemia - f/u anemia panel  Constipation - Miralax     Code Status: Full Code DVT prophylaxis:  SCDs Start: 05/07/22 2129    Consultants: None Level of Care: Level of care: Med-Surg  Objective:   Vitals:   05/08/22 0349 05/08/22 0540 05/08/22 0935 05/08/22 1301  BP:  107/78  124/70 111/77  Pulse:  88 93 81  Resp:  17 16 14   Temp:  97.9 F (36.6 C) 98 F (36.7 C) 98.1 F (36.7 C)  TempSrc:  Oral Oral Oral  SpO2:  96% 100% 96%  Weight: 84.5 kg     Height: 5\' 2"  (1.575 m)      Filed Weights   05/08/22 0147 05/08/22 0349  Weight: 81.6 kg 84.5 kg   Exam: General exam: Appears comfortable  HEENT: oral mucosa moist Respiratory system: Clear to auscultation.  Cardiovascular system: S1 & S2 heard  Gastrointestinal system: Abdomen soft, non-tender, nondistended. Normal bowel sounds   Extremities: No cyanosis, clubbing or edema MSK: lumbosacral pain with straight leg raising and external rotation of the hip Psychiatry:  Mood & affect appropriate.    Imaging and lab data was personally reviewed    CBC: Recent Labs  Lab 05/07/22 1417 05/08/22 0339  WBC 6.7 9.7  NEUTROABS 5.4  --   HGB 12.2 11.6*  HCT 39.6 36.7  MCV 65.2* 64.4*  PLT 276 272   Basic Metabolic Panel: Recent Labs  Lab 05/07/22 1417 05/08/22 0337 05/08/22 0339  NA 143  --  136  K 3.9  --  4.1  CL 109  --  106  CO2 23  --  21*  GLUCOSE 99  --  172*  BUN 11  --  15  CREATININE 0.62  --  0.51  CALCIUM 9.5  --  9.5  MG  --  2.0 2.0  PHOS  --  3.6 3.6   GFR: Estimated Creatinine Clearance: 82 mL/min (by C-G formula based on SCr of 0.51 mg/dL).  Scheduled Meds:  cyclobenzaprine  5-10 mg Oral TID   influenza vac split quadrivalent PF  0.5 mL Intramuscular Tomorrow-1000   polyethylene glycol  17 g Oral Daily   tamsulosin  0.4 mg Oral QPC supper   Continuous Infusions:   LOS: 0 days   Author: Debbe Odea  05/08/2022 2:10 PM

## 2022-05-08 NOTE — Evaluation (Signed)
Physical Therapy Evaluation Patient Details Name: Jill Swanson MRN: [id number] DOB: [birth year] Today's Date: 05/08/2022  History of Present Illness  51 y.o. female with medical history significant of chronic back pain    Admitted for   Urinary retention, back pain  Clinical Impression  Pt admitted with above diagnosis. Instructed pt in log roll for bed mobility. Pt able to roll L and R, and perform sidelying to sit on both L and R. She sat edge of bed for ~5 minutes x 2, and stood at edge of bed x 90 seconds with a RW. Instructed pt to avoid bending, twisting and lifting. Pt stated she's had extensive PT for chronic back pain for which she does daily stretches and strengthening exercises.  Pt currently with functional limitations due to the deficits listed below (see PT Problem List). Pt will benefit from skilled PT to increase their independence and safety with mobility to allow discharge to the venue listed below.          Recommendations for follow up therapy are one component of a multi-disciplinary discharge planning process, led by the attending physician.  Recommendations may be updated based on patient status, additional functional criteria and insurance authorization.  Follow Up Recommendations Outpatient PT      Assistance Recommended at Discharge Set up Supervision/Assistance  Patient can return home with the following  Assistance with cooking/housework;A little help with bathing/dressing/bathroom;Assist for transportation;Help with stairs or ramp for entrance    Equipment Recommendations Rollator (4 wheels)  Recommendations for Other Services       Functional Status Assessment Patient has had a recent decline in their functional status and demonstrates the ability to make significant improvements in function in a reasonable and predictable amount of time.     Precautions / Restrictions Precautions Precautions: Back Precaution Comments: instructed pt in log roll and to  avoid bending/twisting/lifting Restrictions Weight Bearing Restrictions: No      Mobility  Bed Mobility Overal bed mobility: Needs Assistance Bed Mobility: Rolling, Sidelying to Sit Rolling: Supervision Sidelying to sit: Supervision       General bed mobility comments: VCs for log roll technique, bed flat, used rails, rolled L and R and sat EOB, pt reported moving to L was less painful. Increased time/effort for all movement, heavy reliance upon UEs for sidelying to sit.    Transfers Overall transfer level: Needs assistance Equipment used: Rolling walker (2 wheels) Transfers: Sit to/from Stand Sit to Stand: Supervision           General transfer comment: powered up with BUEs on RW    Ambulation/Gait Ambulation/Gait assistance: Supervision Gait Distance (Feet): 2 Feet Assistive device: Rolling walker (2 wheels) Gait Pattern/deviations: Step-to pattern Gait velocity: decr     General Gait Details: side stepped x 3 at edge of bed, pt stood for ~90 seconds, tolerance limited by pain  Stairs            Wheelchair Mobility    Modified Rankin (Stroke Patients Only)       Balance Overall balance assessment: Modified Independent                                           Pertinent Vitals/Pain Pain Assessment Pain Assessment: 0-10 Pain Score: 6  Pain Location: low back at rest Pain Descriptors / Indicators: Spasm Pain Intervention(s): Limited activity within patient's tolerance, Monitored during session,  Patient requesting pain meds-RN notified    Home Living Family/patient expects to be discharged to:: Private residence Living Arrangements: Spouse/significant other;Children Available Help at Discharge: Family Type of Home: House       Alternate Level Stairs-Number of Steps: flight Home Layout: Two level;Able to live on main level with bedroom/bathroom;1/2 bath on main level Home Equipment: Adaptive equipment      Prior Function  Prior Level of Function : Independent/Modified Independent             Mobility Comments: walked without AD, is primary CG for young 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 autism; no falls in past 6 months ADLs Comments: independent     Hand Dominance        Extremity/Trunk Assessment   Upper Extremity Assessment Upper Extremity Assessment: Defer to OT evaluation    Lower Extremity Assessment Lower Extremity Assessment: Overall WFL for tasks assessed (denies numbness/tingling in BLEs)    Cervical / Trunk Assessment Cervical / Trunk Assessment: Normal  Communication   Communication: No difficulties  Cognition Arousal/Alertness: Awake/alert Behavior During Therapy: WFL for tasks assessed/performed Overall Cognitive Status: Within Functional Limits for tasks assessed                                          General Comments      Exercises General Exercises - Lower Extremity Ankle Circles/Pumps: AROM, Both, 10 reps, Supine   Assessment/Plan    PT Assessment Patient needs continued PT services  PT Problem List Decreased activity tolerance;Decreased mobility;Pain       PT Treatment Interventions Therapeutic activities;Therapeutic exercise;Gait training;Functional mobility training;Patient/family education    PT Goals (Current goals can be found in the Care Plan section)  Acute Rehab PT Goals Patient Stated Goal: prevent further back injuries, return to independence PT Goal Formulation: With patient Time For Goal Achievement: 05/22/22 Potential to Achieve Goals: Good    Frequency Min 3X/week     Co-evaluation               AM-PAC PT "6 Clicks" Mobility  Outcome Measure Help needed turning from your back to your side while in a flat bed without using bedrails?: None Help needed moving from lying on your back to sitting on the side of a flat bed without using bedrails?: None Help needed moving to and from a bed to a chair (including a wheelchair)?: A  Little Help needed standing up from a chair using your arms (e.g., wheelchair or bedside chair)?: None Help needed to walk in hospital room?: A Lot Help needed climbing 3-5 steps with a railing? : Total 6 Click Score: 18    End of Session   Activity Tolerance: Patient limited by pain Patient left: in bed;with call bell/phone within reach Nurse Communication: Mobility status PT Visit Diagnosis: Difficulty in walking, not elsewhere classified (R26.2);Pain    Time: 1152-1223 PT Time Calculation (min) (ACUTE ONLY): 31 min   Charges:   PT Evaluation $PT Eval Moderate Complexity: 1 Mod PT Treatments $Therapeutic Activity: 8-22 mins        Blondell Reveal Kistler PT 05/08/2022  Acute Rehabilitation Services  Office [phone]

## 2022-05-09 DIAGNOSIS — E538 Deficiency of other specified B group vitamins: Secondary | ICD-10-CM | POA: Diagnosis not present

## 2022-05-09 DIAGNOSIS — R339 Retention of urine, unspecified: Secondary | ICD-10-CM | POA: Diagnosis not present

## 2022-05-09 LAB — COMPREHENSIVE METABOLIC PANEL
ALT: 20 U/L (ref 0–44)
AST: 16 U/L (ref 15–41)
Albumin: 3.8 g/dL (ref 3.5–5.0)
Alkaline Phosphatase: 58 U/L (ref 38–126)
Anion gap: 6 (ref 5–15)
BUN: 16 mg/dL (ref 6–20)
CO2: 24 mmol/L (ref 22–32)
Calcium: 9 mg/dL (ref 8.9–10.3)
Chloride: 108 mmol/L (ref 98–111)
Creatinine, Ser: 0.56 mg/dL (ref 0.44–1.00)
GFR, Estimated: >60 mL/min (ref >60)
Glucose, Bld: 137 mg/dL — ABNORMAL HIGH (ref 70–99)
Potassium: 4.7 mmol/L (ref 3.5–5.1)
Sodium: 138 mmol/L (ref 135–145)
Total Bilirubin: 1.1 mg/dL (ref 0.3–1.2)
Total Protein: 6.7 g/dL (ref 6.5–8.1)

## 2022-05-09 LAB — IRON AND TIBC
Iron: 64 ug/dL (ref 28–170)
Saturation Ratios: 18 % (ref 10.4–31.8)
TIBC: 365 ug/dL (ref 250–450)
UIBC: 301 ug/dL

## 2022-05-09 LAB — FOLATE: Folate: 10 ng/mL (ref >5.9)

## 2022-05-09 LAB — VITAMIN B12: Vitamin B-12: 205 pg/mL (ref 180–914)

## 2022-05-09 MED ORDER — CYANOCOBALAMIN 1000 MCG/ML IJ SOLN
1000.0000 ug | Freq: Once | INTRAMUSCULAR | Status: AC
  Administered 2022-05-09: 1000 ug via SUBCUTANEOUS
  Filled 2022-05-09 (×2): qty 1

## 2022-05-09 MED ORDER — VITAMIN B-12 1000 MCG PO TABS
1000.0000 ug | ORAL_TABLET | Freq: Every day | ORAL | Status: DC
  Administered 2022-05-10: 1000 ug via ORAL
  Filled 2022-05-09: qty 1

## 2022-05-09 MED ORDER — CHLORHEXIDINE GLUCONATE CLOTH 2 % EX PADS
6.0000 | MEDICATED_PAD | Freq: Every day | CUTANEOUS | Status: DC
  Administered 2022-05-09: 6 via TOPICAL

## 2022-05-09 NOTE — Progress Notes (Addendum)
Physical Therapy Treatment Patient Details Name: Jill Swanson MRN: [id number] DOB: [birth year] Today's Date: 05/09/2022   History of Present Illness 51 y.o. female with medical history significant of chronic back pain    Admitted for   Urinary retention, back pain    PT Comments    Reviewed log roll technique and back precautions. Pt able to perform bed mobility without physical assist. She ambulated in the hallway 50' with RW, no loss of balance, 6/10 LBP with walking, pt denied radicular symptoms.  She tolerated sitting upright in the recliner for 10 minutes. Pt voided in bathroom, RN notified. Overall improved activity tolerance today. Pt was encouraged to attempt short bouts of ambulation and sitting upright throughout the day to minimize deconditioning.     Recommendations for follow up therapy are one component of a multi-disciplinary discharge planning process, led by the attending physician.  Recommendations may be updated based on patient status, additional functional criteria and insurance authorization.  Follow Up Recommendations  Home health PT     Assistance Recommended at Discharge Set up Supervision/Assistance  Patient can return home with the following Assistance with cooking/housework;A little help with bathing/dressing/bathroom;Assist for transportation;Help with stairs or ramp for entrance   Equipment Recommendations  Rollator (4 wheels)    Recommendations for Other Services       Precautions / Restrictions Precautions Precautions: Back Precaution Comments: Patient demonstrated good ability to maintain back precautions. Reviewed no bending/twisting/lifting and log roll Restrictions Weight Bearing Restrictions: No     Mobility  Bed Mobility Overal bed mobility: Needs Assistance Bed Mobility: Rolling, Sidelying to Sit, Sit to Sidelying Rolling: Supervision Sidelying to sit: Supervision     Sit to sidelying: Supervision General bed mobility comments:  Good recall of log roll technique, used rails. Less labored today.    Transfers Overall transfer level: Needs assistance Equipment used: Rolling walker (2 wheels) Transfers: Sit to/from Stand Sit to Stand: Supervision           General transfer comment: VCs hand placement. Pt sat in recliner for 10 minutes.    Ambulation/Gait Ambulation/Gait assistance: Supervision Gait Distance (Feet): 50 Feet Assistive device: Rolling walker (2 wheels) Gait Pattern/deviations: Step-through pattern, Decreased stride length Gait velocity: decr     General Gait Details: slow but steady with RW, no loss of balance, VCs to minimize trunk rotation when making turns   Optometrist    Modified Rankin (Stroke Patients Only)       Balance Overall balance assessment: Modified Independent                                          Cognition Arousal/Alertness: Awake/alert Behavior During Therapy: WFL for tasks assessed/performed Overall Cognitive Status: Within Functional Limits for tasks assessed                                          Exercises      General Comments        Pertinent Vitals/Pain Pain Assessment Pain Score: 6  Pain Location: low back with ambulation Pain Descriptors / Indicators: Spasm, Grimacing, Guarding Pain Intervention(s): Limited activity within patient's tolerance, Monitored during session, Premedicated before session, Repositioned, Heat applied    Home  Living                          Prior Function            PT Goals (current goals can now be found in the care plan section) Acute Rehab PT Goals Patient Stated Goal: prevent further back injuries, return to independence PT Goal Formulation: With patient Time For Goal Achievement: 05/22/22 Potential to Achieve Goals: Good Progress towards PT goals: Progressing toward goals    Frequency    Min 3X/week      PT Plan  Discharge plan needs to be updated    Co-evaluation              AM-PAC PT "6 Clicks" Mobility   Outcome Measure  Help needed turning from your back to your side while in a flat bed without using bedrails?: None Help needed moving from lying on your back to sitting on the side of a flat bed without using bedrails?: None Help needed moving to and from a bed to a chair (including a wheelchair)?: None Help needed standing up from a chair using your arms (e.g., wheelchair or bedside chair)?: None Help needed to walk in hospital room?: None Help needed climbing 3-5 steps with a railing? : A Little 6 Click Score: 23    End of Session   Activity Tolerance: Patient tolerated treatment well;Patient limited by pain Patient left: in bed;with call bell/phone within reach Nurse Communication: Mobility status PT Visit Diagnosis: Difficulty in walking, not elsewhere classified (R26.2);Pain     Time: 1212-1253 PT Time Calculation (min) (ACUTE ONLY): 41 min  Charges:  $Gait Training: 8-22 mins $Therapeutic Exercise: 23-37 mins                     Blondell Reveal Kistler PT 05/09/2022  Acute Rehabilitation Services  Office [phone]

## 2022-05-09 NOTE — TOC Transition Note (Signed)
Transition of Care [facility]) - CM/SW Discharge Note   Patient Details  Name: Jill Swanson MRN: [id number] Date of Birth: [birth year]  [facility]) CM/SW Contact:  Lennart Pall, LCSW Phone Number: 05/09/2022, 2:32 PM   Clinical Narrative:    Met with pt and reviewing the therapy recommendations for DME (RW and 3n1) and HHPT/OT follow up.  Pt is agreeable and has no agency preferences.  Have placed order for DME with [facility] for delivery to room.  [place] services arranged with [facility] (contact info on AVS).  No further TOC needs.   Final next level of care: [place] Barriers to Discharge: No Barriers Identified   Patient Goals and CMS Choice Patient states their goals for this hospitalization and ongoing recovery are:: return  home      Discharge Placement                       Discharge Plan and Services                DME Arranged: 3-N-1, Walker rolling DME Agency: [employer] Date DME Agency Contacted: 05/09/22 Time DME Agency Contacted: [id number] Representative spoke with at DME Agency: [place]: PT, OT [place] Agency: [place] Date [place]: 05/09/22 Time [place]: 1432 Representative spoke with at [place]: Joy Determinants of Health ([place]) Interventions     Readmission Risk Interventions    05/09/2022    2:31 PM  Readmission Risk Prevention Plan  Post Dischage Appt Complete  Medication Screening Complete  Transportation Screening Complete

## 2022-05-09 NOTE — Plan of Care (Signed)

## 2022-05-09 NOTE — Plan of Care (Signed)

## 2022-05-09 NOTE — Progress Notes (Signed)
[facility] Progress Note  Patient: Jill Swanson     YWV:[id number]  DOA: 05/07/2022   PCP: Lois Huxley, PA       Brief hospital course: This is a 51 year old female with thalassemia chronic back pain who developed acute sudden onset of lower back pain on 10/25 after bending down.  The pain continued and eventually she realized that she was unable to urinate and she developed lower abdominal discomfort.  In the ED, a Foley catheter was placed and 900 cc of urine subsequently drained out which improved her abdominal discomfort.  She continued to have severe back pain. MRI of the lumbar spine was ordered and revealed only a small central disc protrusion at L4-L5 with mild right lateral recess narrowing. She received fentanyl, Dilaudid, IV Ativan, Flomax and dose of Solu-Medrol on 10/25  Subjective:  She continues to have severe lower back pain even on just ambulating to the bathroom.   Assessment and Plan: Principal Problem:   Urinary retention - obtain voiding trial today  Active Problems:   Back pain -Severe lumbosacral spasm bilaterally - continue Flexeril and hydrocodone PRN -  I would like to give  Toradol but she is allergic to NSAIDs (Ibuprofen caused anaphylaxis)    Nausea and poor oral intake with severe hiccups and dehydration - She received a dose of IV Zofran this morning - Have ordered normal saline    Elevated T bili Metabolic acidosis - has resolved  Microcytic anemia - anemia panel actually shows a B12 level of 205  Constipation - Miralax     Code Status: Full Code DVT prophylaxis:  SCDs Start: 05/07/22 2129    Consultants: None Level of Care: Level of care: Med-Surg  Objective:   Vitals:   05/08/22 2216 05/09/22 0601 05/09/22 0930 05/09/22 1308  BP: 111/68 107/64 123/73 122/73  Pulse: 76 74 (!) 104 85  Resp: 17 17 17 16   Temp: 97.7 F (36.5 C) 97.7 F (36.5 C) 97.7 F (36.5 C) 98 F (36.7 C)  TempSrc: Oral Oral Oral Oral   SpO2: 97% 96% 99% 96%  Weight:      Height:       Filed Weights   05/08/22 0147 05/08/22 0349  Weight: 81.6 kg 84.5 kg   Exam: General exam: Appears comfortable  HEENT: oral mucosa moist Respiratory system: Clear to auscultation.  Cardiovascular system: S1 & S2 heard  Gastrointestinal system: Abdomen soft, non-tender, nondistended. Normal bowel sounds   Extremities: No cyanosis, clubbing or edema Psychiatry:  Mood & affect appropriate.    Imaging and lab data was personally reviewed    CBC: Recent Labs  Lab 05/07/22 1417 05/08/22 0339  WBC 6.7 9.7  NEUTROABS 5.4  --   HGB 12.2 11.6*  HCT 39.6 36.7  MCV 65.2* 64.4*  PLT 276 854    Basic Metabolic Panel: Recent Labs  Lab 05/07/22 1417 05/08/22 0337 05/08/22 0339 05/09/22 0334  NA 143  --  136 138  K 3.9  --  4.1 4.7  CL 109  --  106 108  CO2 23  --  21* 24  GLUCOSE 99  --  172* 137*  BUN 11  --  15 16  CREATININE 0.62  --  0.51 0.56  CALCIUM 9.5  --  9.5 9.0  MG  --  2.0 2.0  --   PHOS  --  3.6 3.6  --     GFR: Estimated Creatinine Clearance: 82 mL/min (by C-G formula based on  SCr of 0.56 mg/dL).  Scheduled Meds:  Chlorhexidine Gluconate Cloth  6 each Topical Daily   cyclobenzaprine  5-10 mg Oral TID   influenza vac split quadrivalent PF  0.5 mL Intramuscular Tomorrow-1000   polyethylene glycol  17 g Oral Daily   tamsulosin  0.4 mg Oral QPC supper   Continuous Infusions:  sodium chloride 50 mL/hr at 05/09/22 1520     LOS: 1 day   Author: Calvert Cantor  05/09/2022 3:34 PM

## 2022-05-09 NOTE — Progress Notes (Signed)
Occupational Therapy Treatment Patient Details Name: Jill Swanson MRN: [id number] DOB: 03-11-1971 Today's Date: 05/09/2022   History of present illness 51 y.o. female with medical history significant of chronic back pain    Admitted for   Urinary retention, back pain   OT comments  Treatment focused on ADL training and advancing ADLs to OOB. Patient able to ambulate to bathroom and perform standing grooming task with compensatory strategies. Pain 6/10 initially and ramped up to 10/10 in bathroom doorway on return to bed. Encouraged deep breathing techniques to assist with pain control. Patient educated on use of ADL kit and therapist demonstrated use. Patient verbalized understanding. Also discussed potential need for shower chair or tub bench. Patient will order if needed.    Recommendations for follow up therapy are one component of a multi-disciplinary discharge planning process, led by the attending physician.  Recommendations may be updated based on patient status, additional functional criteria and insurance authorization.    Follow Up Recommendations  No OT follow up    Assistance Recommended at Discharge Intermittent Supervision/Assistance  Patient can return home with the following  A little help with walking and/or transfers;A little help with bathing/dressing/bathroom;Assistance with cooking/housework;Help with stairs or ramp for entrance   Equipment Recommendations  BSC/3in1    Recommendations for Other Services      Precautions / Restrictions Precautions Precautions: Back Precaution Comments: Patient demonstratd good ability to maintain back precautions Restrictions Weight Bearing Restrictions: No       Mobility Bed Mobility Overal bed mobility: Needs Assistance Bed Mobility: Rolling, Sidelying to Sit, Sit to Sidelying Rolling: Supervision Sidelying to sit: Supervision Supine to sit: Supervision Sit to supine: Supervision Sit to sidelying: Supervision       Transfers Overall transfer level: Needs assistance Equipment used: Rolling walker (2 wheels)   Sit to Stand: Min guard           General transfer comment: MIn guard to ambulate to bathroom and back.     Balance Overall balance assessment: Mild deficits observed, not formally tested                                         ADL either performed or assessed with clinical judgement   ADL Overall ADL's : Needs assistance/impaired     Grooming: Oral care;Standing Grooming Details (indicate cue type and reason): stood at sink. Used two cup method to limit bending for oral care.                             Functional mobility during ADLs: Min guard General ADL Comments: Educated and demonstrated use of all pieces of ADL kit. Patient verbalized understanding. Discussed need for [facility] and potential shower chair. Patient has some difficulties with home environment that may lead her to not be able to shower initially at home.    Extremity/Trunk Assessment Upper Extremity Assessment Upper Extremity Assessment: Overall WFL for tasks assessed   Lower Extremity Assessment Lower Extremity Assessment: Defer to PT evaluation   Cervical / Trunk Assessment Cervical / Trunk Assessment: Normal    Vision Baseline Vision/History: 1 Wears glasses Ability to See in Adequate Light: 1 Impaired     Perception     Praxis      Cognition Arousal/Alertness: Awake/alert Behavior During Therapy: WFL for tasks assessed/performed Overall Cognitive Status: Within Functional Limits for  tasks assessed                                          Exercises      Shoulder Instructions       General Comments      Pertinent Vitals/ Pain       Pain Assessment Pain Assessment: 0-10 Pain Score: 10-Worst pain ever Pain Location: in middle of ambulation, 8/10 with activity Pain Descriptors / Indicators: Spasm, Grimacing, Guarding Pain Intervention(s):  Limited activity within patient's tolerance, Monitored during session, Premedicated before session  Home Living                                          Prior Functioning/Environment              Frequency  Min 2X/week        Progress Toward Goals  OT Goals(current goals can now be found in the care plan section)  Progress towards OT goals: Progressing toward goals  Acute Rehab OT Goals Patient Stated Goal: less pain OT Goal Formulation: With patient Time For Goal Achievement: 05/23/22 Potential to Achieve Goals: Good  Plan Discharge plan remains appropriate    Co-evaluation                 AM-PAC OT "6 Clicks" Daily Activity     Outcome Measure   Help from another person eating meals?: None Help from another person taking care of personal grooming?: A Little Help from another person toileting, which includes using toliet, bedpan, or urinal?: A Lot Help from another person bathing (including washing, rinsing, drying)?: A Lot Help from another person to put on and taking off regular upper body clothing?: A Little Help from another person to put on and taking off regular lower body clothing?: A Lot 6 Click Score: 16    End of Session Equipment Utilized During Treatment: Rolling walker (2 wheels)  OT Visit Diagnosis: Pain   Activity Tolerance Patient limited by pain   Patient Left in bed;with call bell/phone within reach   Nurse Communication Mobility status        Time: 3716-9678 OT Time Calculation (min): 31 min  Charges: OT General Charges $OT Visit: 1 Visit OT Treatments $Self Care/Home Management : 23-37 mins  Gustavo Lah, OTR/L Acute Care Rehab Services  Office [phone]   [name] 05/09/2022, 10:04 AM

## 2022-05-09 NOTE — Plan of Care (Signed)
  Problem: Education: Goal: Knowledge of General Education information will improve Description: Including pain rating scale, medication(s)/side effects and non-pharmacologic comfort measures Outcome: Progressing   Problem: Elimination: Goal: Will not experience complications related to urinary retention Outcome: Progressing   Problem: Pain Managment: Goal: General experience of comfort will improve Outcome: Progressing   Problem: Safety: Goal: Ability to remain free from injury will improve Outcome: Progressing   

## 2022-05-10 DIAGNOSIS — R339 Retention of urine, unspecified: Secondary | ICD-10-CM | POA: Diagnosis not present

## 2022-05-10 DIAGNOSIS — M62830 Muscle spasm of back: Secondary | ICD-10-CM

## 2022-05-10 MED ORDER — HYDROCODONE-ACETAMINOPHEN 5-325 MG PO TABS: 1.0000 | ORAL_TABLET | Freq: Four times a day (QID) | ORAL | 0 refills | Status: AC | PRN

## 2022-05-10 MED ORDER — CYCLOBENZAPRINE HCL 5 MG PO TABS: 5.0000 mg | ORAL_TABLET | Freq: Three times a day (TID) | ORAL | 0 refills | Status: AC | PRN

## 2022-05-10 MED ORDER — SENNOSIDES-DOCUSATE SODIUM 8.6-50 MG PO TABS: 1.0000 | ORAL_TABLET | Freq: Every evening | ORAL | 0 refills | Status: AC | PRN

## 2022-05-10 MED ORDER — POLYETHYLENE GLYCOL 3350 17 G PO PACK: 17.0000 g | PACK | Freq: Every day | ORAL | 0 refills | Status: AC

## 2022-05-10 MED ORDER — BISACODYL 10 MG RE SUPP: 10.0000 mg | Freq: Every day | RECTAL | 0 refills | Status: AC | PRN

## 2022-05-10 MED ORDER — CYANOCOBALAMIN 1000 MCG PO TABS: 1000.0000 ug | ORAL_TABLET | Freq: Every day | ORAL | 0 refills | Status: AC

## 2022-05-10 NOTE — Plan of Care (Signed)
  Problem: Education: Goal: Knowledge of General Education information will improve Description: Including pain rating scale, medication(s)/side effects and non-pharmacologic comfort measures Outcome: Progressing   Problem: Health Behavior/Discharge Planning: Goal: Ability to manage health-related needs will improve Outcome: Progressing   Problem: Activity: Goal: Risk for activity intolerance will decrease Outcome: Progressing   

## 2022-05-10 NOTE — Discharge Summary (Signed)
Physician Discharge Summary  Jill Swanson [id number] DOB: 03-18-1971 DOA: 05/07/2022  PCP: Lois Huxley, PA  Admit date: 05/07/2022 Discharge date: 05/10/2022 Discharging to: home     Discharge Diagnoses:   Principal Problem:   Urinary retention Active Problems:   severe lumbar spasm     Hospital Course:  This is a 51 year old female with thalassemia chronic back pain who developed acute sudden onset of lower back pain on 10/25 after bending down.  The pain continued and eventually she realized that she was unable to urinate and she developed lower abdominal discomfort.  In the ED, a Foley catheter was placed and 900 cc of urine subsequently drained out which improved her abdominal discomfort.  She continued to have severe back pain. MRI of the lumbar spine was ordered and revealed only a small central disc protrusion at L4-L5 with mild right lateral recess narrowing. She received fentanyl, Dilaudid, IV Ativan, Flomax and dose of Solu-Medrol on 10/25  Principal Problem:   Urinary retention - has resolved and was likely secondary to severe back spasm   Active Problems:   Back pain -Severe lumbosacral spasm bilaterally - continue Flexeril and hydrocodone PRN -  Unable to giveToradol but she is allergic to NSAIDs (Ibuprofen caused anaphylaxis)     Nausea and poor oral intake with severe hiccups and dehydration - treated with Zofran and IVF   Elevated T bili Metabolic acidosis - has resolved   Microcytic anemia- h/o thalassemia  - anemia panel actually shows a B12 level of 205- has been replaced with a one time s/c injection- oral B12 prescribed   Constipation - Miralax, Senna and PRN Dulcolax suppository prescribed  Obesity Body mass index is 34.07 kg/m.           Discharge Instructions  Discharge Instructions     Diet - low sodium heart healthy   Complete by: As directed    Increase activity slowly   Complete by: As directed       Allergies as  of 05/10/2022       Reactions   Penicillins Hives, Itching, Rash   Medicinal Product Containing Penicillin And Acting As Antibacterial Agent (product)   Ibuprofen Other (See Comments)   unk        Medication List     TAKE these medications    bisacodyl 10 MG suppository Commonly known as: Dulcolax Place 1 suppository (10 mg total) rectally daily as needed for moderate constipation.   cyanocobalamin 1000 MCG tablet Take 1 tablet (1,000 mcg total) by mouth daily. Start taking on: May 11, 2022   cyclobenzaprine 5 MG tablet Commonly known as: FLEXERIL Take 1-2 tablets (5-10 mg total) by mouth 3 (three) times daily as needed for muscle spasms. What changed: how much to take   HYDROcodone-acetaminophen 5-325 MG tablet Commonly known as: NORCO/VICODIN Take 1-2 tablets by mouth every 6 (six) hours as needed for moderate pain.   polyethylene glycol 17 g packet Commonly known as: MIRALAX / GLYCOLAX Take 17 g by mouth daily. Start taking on: May 11, 2022   senna-docusate 8.6-50 MG tablet Commonly known as: Senokot-S Take 1-2 tablets by mouth at bedtime as needed for mild constipation.   TYLENOL 500 MG tablet Generic drug: acetaminophen Tylenol Extra Strength 500 mg tablet               Durable Medical Equipment  (From admission, onward)           Start     Ordered  05/09/22 1249  For home use only DME 3 n 1  Once        05/09/22 1248   05/08/22 1251  For home use only DME 4 wheeled rolling walker with seat  Once       Question:  Patient needs a walker to treat with the following condition  Answer:  Difficulty in walking, not elsewhere classified   05/08/22 1251            Follow-up Information     [facility] Follow up.   Specialty: [place] Why: to provide home therapy visits Contact information: [address] [place] [address] [phone]                    The results of significant  diagnostics from this hospitalization (including imaging, microbiology, ancillary and laboratory) are listed below for reference.    CT Renal Stone Study  Result Date: 05/07/2022 CLINICAL DATA:  urinary retention EXAM: CT ABDOMEN AND PELVIS WITHOUT CONTRAST TECHNIQUE: Multidetector CT imaging of the abdomen and pelvis was performed following the standard protocol without IV contrast. RADIATION DOSE REDUCTION: This exam was performed according to the departmental dose-optimization program which includes automated exposure control, adjustment of the mA and/or kV according to patient size and/or use of iterative reconstruction technique. COMPARISON:  None Available. FINDINGS: Lower chest: No acute abnormality. Hepatobiliary: No focal liver abnormality. No gallstones, gallbladder wall thickening, or pericholecystic fluid. No biliary dilatation. Pancreas: No focal lesion. Normal pancreatic contour. No surrounding inflammatory changes. No main pancreatic ductal dilatation. Spleen: Normal in size without focal abnormality. Adrenals/Urinary Tract: No adrenal nodule bilaterally. No nephrolithiasis and no hydronephrosis. No definite contour-deforming renal mass. No ureterolithiasis or hydroureter. The urinary bladder is decompressed with tip and inflated balloon within the urinary bladder lumen. Stomach/Bowel: Stomach is within normal limits. No evidence of bowel wall thickening or dilatation. Appendix appears normal. Appendicoliths noted within the lumen. Vascular/Lymphatic: No abdominal aorta or iliac aneurysm. No abdominal, pelvic, or inguinal lymphadenopathy. Reproductive: Uterus and bilateral adnexa are unremarkable. Other: No intraperitoneal free fluid. No intraperitoneal free gas. No organized fluid collection. Musculoskeletal: No abdominal wall hernia or abnormality. No suspicious lytic or blastic osseous lesions. No acute displaced fracture. Multilevel degenerative changes of the spine. IMPRESSION: No acute  intra-abdominal or intrapelvic abnormality. Electronically Signed   By: Iven Finn M.D.   On: 05/07/2022 18:37   MR LUMBAR SPINE WO CONTRAST  Result Date: 05/07/2022 CLINICAL DATA:  Cauda equina syndrome suspected. EXAM: MRI LUMBAR SPINE WITHOUT CONTRAST TECHNIQUE: Multiplanar, multisequence MR imaging of the lumbar spine was performed. No intravenous contrast was administered. COMPARISON:  None Available. FINDINGS: Segmentation:  Standard. Alignment:  Physiologic. Vertebrae:  No fracture, evidence of discitis, or bone lesion. Conus medullaris and cauda equina: Conus extends to the L1 level. Conus and cauda equina appear normal. Paraspinal and other soft tissues: Negative. Disc levels: T11-T12: Only imaged in the sagittal plane. No evidence of spinal canal or neural foraminal stenosis. T12-L1: No disc bulge. No significant facet degenerative change. No spinal canal stenosis. No neural foraminal stenosis. L1-L2: No disc bulge. No significant facet degenerative change. No spinal canal stenosis. No neural foraminal stenosis. L2-L3: No disc bulge. No significant facet degenerative change. No spinal canal stenosis. No neural foraminal stenosis. L3-L4: No disc bulge. No significant facet degenerative change. No spinal canal stenosis. No neural foraminal stenosis. L4-L5: Small central disc protrusion. No overall spinal canal stenosis. Mild right lateral recess  narrowing. No neural foraminal stenosis. L5-S1: No disc bulge. No significant facet degenerative change. No spinal canal stenosis. No neural foraminal stenosis. IMPRESSION: 1. No high-grade spinal canal or neural foraminal stenosis at any level. 2. Small central disc protrusion at L4-L5 with mild right lateral recess narrowing. Electronically Signed   By: Marin Roberts M.D.   On: 05/07/2022 15:42   Labs:   Basic Metabolic Panel: Recent Labs  Lab 05/07/22 1417 05/08/22 0337 05/08/22 0339 05/09/22 0334  NA 143  --  136 138  K 3.9  --  4.1 4.7  CL  109  --  106 108  CO2 23  --  21* 24  GLUCOSE 99  --  172* 137*  BUN 11  --  15 16  CREATININE 0.62  --  0.51 0.56  CALCIUM 9.5  --  9.5 9.0  MG  --  2.0 2.0  --   PHOS  --  3.6 3.6  --      CBC: Recent Labs  Lab 05/07/22 1417 05/08/22 0339  WBC 6.7 9.7  NEUTROABS 5.4  --   HGB 12.2 11.6*  HCT 39.6 36.7  MCV 65.2* 64.4*  PLT 276 272         SIGNED:   Debbe Odea, MD  [facility] 05/10/2022, 12:37 PM

## 2022-05-10 NOTE — Progress Notes (Signed)
The patient is alert and oriented and has been seen by her physician. The orders for discharge were written. IV has been removed. Went over discharge instructions with patient and family. She is being discharged via wheelchair with all of her belongings.  

## 2022-05-15 DIAGNOSIS — G8929 Other chronic pain: Secondary | ICD-10-CM | POA: Diagnosis not present

## 2022-05-15 DIAGNOSIS — M5116 Intervertebral disc disorders with radiculopathy, lumbar region: Secondary | ICD-10-CM | POA: Diagnosis not present

## 2022-05-15 DIAGNOSIS — D649 Anemia, unspecified: Secondary | ICD-10-CM | POA: Diagnosis not present

## 2022-05-15 DIAGNOSIS — K5900 Constipation, unspecified: Secondary | ICD-10-CM | POA: Diagnosis not present

## 2022-05-19 DIAGNOSIS — M5116 Intervertebral disc disorders with radiculopathy, lumbar region: Secondary | ICD-10-CM | POA: Diagnosis not present

## 2022-05-22 DIAGNOSIS — K5900 Constipation, unspecified: Secondary | ICD-10-CM | POA: Diagnosis not present

## 2022-05-22 DIAGNOSIS — D649 Anemia, unspecified: Secondary | ICD-10-CM | POA: Diagnosis not present

## 2022-05-22 DIAGNOSIS — G8929 Other chronic pain: Secondary | ICD-10-CM | POA: Diagnosis not present

## 2022-05-22 DIAGNOSIS — Z6831 Body mass index (BMI) 31.0-31.9, adult: Secondary | ICD-10-CM | POA: Diagnosis not present

## 2022-05-22 DIAGNOSIS — M5116 Intervertebral disc disorders with radiculopathy, lumbar region: Secondary | ICD-10-CM | POA: Diagnosis not present

## 2022-05-22 DIAGNOSIS — Z9181 History of falling: Secondary | ICD-10-CM | POA: Diagnosis not present

## 2022-05-22 DIAGNOSIS — E669 Obesity, unspecified: Secondary | ICD-10-CM | POA: Diagnosis not present

## 2022-05-23 DIAGNOSIS — F439 Reaction to severe stress, unspecified: Secondary | ICD-10-CM | POA: Diagnosis not present

## 2022-05-23 DIAGNOSIS — K5900 Constipation, unspecified: Secondary | ICD-10-CM | POA: Diagnosis not present

## 2022-05-23 DIAGNOSIS — D649 Anemia, unspecified: Secondary | ICD-10-CM | POA: Diagnosis not present

## 2022-05-23 DIAGNOSIS — M5116 Intervertebral disc disorders with radiculopathy, lumbar region: Secondary | ICD-10-CM | POA: Diagnosis not present

## 2022-05-23 DIAGNOSIS — Z9181 History of falling: Secondary | ICD-10-CM | POA: Diagnosis not present

## 2022-05-23 DIAGNOSIS — D569 Thalassemia, unspecified: Secondary | ICD-10-CM | POA: Diagnosis not present

## 2022-05-23 DIAGNOSIS — E669 Obesity, unspecified: Secondary | ICD-10-CM | POA: Diagnosis not present

## 2022-05-23 DIAGNOSIS — M5126 Other intervertebral disc displacement, lumbar region: Secondary | ICD-10-CM | POA: Diagnosis not present

## 2022-05-23 DIAGNOSIS — E538 Deficiency of other specified B group vitamins: Secondary | ICD-10-CM | POA: Diagnosis not present

## 2022-05-23 DIAGNOSIS — Z6831 Body mass index (BMI) 31.0-31.9, adult: Secondary | ICD-10-CM | POA: Diagnosis not present

## 2022-05-23 DIAGNOSIS — G8929 Other chronic pain: Secondary | ICD-10-CM | POA: Diagnosis not present

## 2022-05-26 DIAGNOSIS — E669 Obesity, unspecified: Secondary | ICD-10-CM | POA: Diagnosis not present

## 2022-05-26 DIAGNOSIS — D649 Anemia, unspecified: Secondary | ICD-10-CM | POA: Diagnosis not present

## 2022-05-26 DIAGNOSIS — Z9181 History of falling: Secondary | ICD-10-CM | POA: Diagnosis not present

## 2022-05-26 DIAGNOSIS — G8929 Other chronic pain: Secondary | ICD-10-CM | POA: Diagnosis not present

## 2022-05-26 DIAGNOSIS — K5900 Constipation, unspecified: Secondary | ICD-10-CM | POA: Diagnosis not present

## 2022-05-26 DIAGNOSIS — Z6831 Body mass index (BMI) 31.0-31.9, adult: Secondary | ICD-10-CM | POA: Diagnosis not present

## 2022-05-26 DIAGNOSIS — M5116 Intervertebral disc disorders with radiculopathy, lumbar region: Secondary | ICD-10-CM | POA: Diagnosis not present

## 2022-05-27 DIAGNOSIS — E669 Obesity, unspecified: Secondary | ICD-10-CM | POA: Diagnosis not present

## 2022-05-27 DIAGNOSIS — Z9181 History of falling: Secondary | ICD-10-CM | POA: Diagnosis not present

## 2022-05-27 DIAGNOSIS — M5116 Intervertebral disc disorders with radiculopathy, lumbar region: Secondary | ICD-10-CM | POA: Diagnosis not present

## 2022-05-27 DIAGNOSIS — G8929 Other chronic pain: Secondary | ICD-10-CM | POA: Diagnosis not present

## 2022-05-27 DIAGNOSIS — K5900 Constipation, unspecified: Secondary | ICD-10-CM | POA: Diagnosis not present

## 2022-05-27 DIAGNOSIS — D649 Anemia, unspecified: Secondary | ICD-10-CM | POA: Diagnosis not present

## 2022-05-27 DIAGNOSIS — Z6831 Body mass index (BMI) 31.0-31.9, adult: Secondary | ICD-10-CM | POA: Diagnosis not present

## 2022-05-30 DIAGNOSIS — Z9181 History of falling: Secondary | ICD-10-CM | POA: Diagnosis not present

## 2022-05-30 DIAGNOSIS — Z6831 Body mass index (BMI) 31.0-31.9, adult: Secondary | ICD-10-CM | POA: Diagnosis not present

## 2022-05-30 DIAGNOSIS — M5116 Intervertebral disc disorders with radiculopathy, lumbar region: Secondary | ICD-10-CM | POA: Diagnosis not present

## 2022-05-30 DIAGNOSIS — D649 Anemia, unspecified: Secondary | ICD-10-CM | POA: Diagnosis not present

## 2022-05-30 DIAGNOSIS — G8929 Other chronic pain: Secondary | ICD-10-CM | POA: Diagnosis not present

## 2022-05-30 DIAGNOSIS — K5900 Constipation, unspecified: Secondary | ICD-10-CM | POA: Diagnosis not present

## 2022-05-30 DIAGNOSIS — E669 Obesity, unspecified: Secondary | ICD-10-CM | POA: Diagnosis not present

## 2022-06-02 DIAGNOSIS — G8929 Other chronic pain: Secondary | ICD-10-CM | POA: Diagnosis not present

## 2022-06-02 DIAGNOSIS — M5116 Intervertebral disc disorders with radiculopathy, lumbar region: Secondary | ICD-10-CM | POA: Diagnosis not present

## 2022-06-02 DIAGNOSIS — K5900 Constipation, unspecified: Secondary | ICD-10-CM | POA: Diagnosis not present

## 2022-06-02 DIAGNOSIS — Z9181 History of falling: Secondary | ICD-10-CM | POA: Diagnosis not present

## 2022-06-02 DIAGNOSIS — D649 Anemia, unspecified: Secondary | ICD-10-CM | POA: Diagnosis not present

## 2022-06-02 DIAGNOSIS — E669 Obesity, unspecified: Secondary | ICD-10-CM | POA: Diagnosis not present

## 2022-06-02 DIAGNOSIS — Z6831 Body mass index (BMI) 31.0-31.9, adult: Secondary | ICD-10-CM | POA: Diagnosis not present

## 2022-06-09 DIAGNOSIS — R635 Abnormal weight gain: Secondary | ICD-10-CM | POA: Diagnosis not present

## 2022-06-09 DIAGNOSIS — Z6832 Body mass index (BMI) 32.0-32.9, adult: Secondary | ICD-10-CM | POA: Diagnosis not present

## 2022-06-10 DIAGNOSIS — Z6831 Body mass index (BMI) 31.0-31.9, adult: Secondary | ICD-10-CM | POA: Diagnosis not present

## 2022-06-10 DIAGNOSIS — M5116 Intervertebral disc disorders with radiculopathy, lumbar region: Secondary | ICD-10-CM | POA: Diagnosis not present

## 2022-06-10 DIAGNOSIS — D649 Anemia, unspecified: Secondary | ICD-10-CM | POA: Diagnosis not present

## 2022-06-10 DIAGNOSIS — K5900 Constipation, unspecified: Secondary | ICD-10-CM | POA: Diagnosis not present

## 2022-06-10 DIAGNOSIS — E669 Obesity, unspecified: Secondary | ICD-10-CM | POA: Diagnosis not present

## 2022-06-10 DIAGNOSIS — G8929 Other chronic pain: Secondary | ICD-10-CM | POA: Diagnosis not present

## 2022-06-10 DIAGNOSIS — Z9181 History of falling: Secondary | ICD-10-CM | POA: Diagnosis not present

## 2022-06-12 DIAGNOSIS — K5900 Constipation, unspecified: Secondary | ICD-10-CM | POA: Diagnosis not present

## 2022-06-12 DIAGNOSIS — Z9181 History of falling: Secondary | ICD-10-CM | POA: Diagnosis not present

## 2022-06-12 DIAGNOSIS — M5116 Intervertebral disc disorders with radiculopathy, lumbar region: Secondary | ICD-10-CM | POA: Diagnosis not present

## 2022-06-12 DIAGNOSIS — Z6831 Body mass index (BMI) 31.0-31.9, adult: Secondary | ICD-10-CM | POA: Diagnosis not present

## 2022-06-12 DIAGNOSIS — D649 Anemia, unspecified: Secondary | ICD-10-CM | POA: Diagnosis not present

## 2022-06-12 DIAGNOSIS — E669 Obesity, unspecified: Secondary | ICD-10-CM | POA: Diagnosis not present

## 2022-06-12 DIAGNOSIS — G8929 Other chronic pain: Secondary | ICD-10-CM | POA: Diagnosis not present

## 2022-06-13 DIAGNOSIS — N393 Stress incontinence (female) (male): Secondary | ICD-10-CM | POA: Diagnosis not present

## 2022-06-13 DIAGNOSIS — R3914 Feeling of incomplete bladder emptying: Secondary | ICD-10-CM | POA: Diagnosis not present

## 2022-06-17 DIAGNOSIS — Z1329 Encounter for screening for other suspected endocrine disorder: Secondary | ICD-10-CM | POA: Diagnosis not present

## 2022-06-17 DIAGNOSIS — N951 Menopausal and female climacteric states: Secondary | ICD-10-CM | POA: Diagnosis not present

## 2022-06-17 DIAGNOSIS — Z131 Encounter for screening for diabetes mellitus: Secondary | ICD-10-CM | POA: Diagnosis not present

## 2022-06-17 DIAGNOSIS — E559 Vitamin D deficiency, unspecified: Secondary | ICD-10-CM | POA: Diagnosis not present

## 2022-06-17 DIAGNOSIS — E7800 Pure hypercholesterolemia, unspecified: Secondary | ICD-10-CM | POA: Diagnosis not present

## 2022-06-17 DIAGNOSIS — Z130 Encounter for screening for diseases of the blood and blood-forming organs and certain disorders involving the immune mechanism: Secondary | ICD-10-CM | POA: Diagnosis not present

## 2022-06-17 DIAGNOSIS — E669 Obesity, unspecified: Secondary | ICD-10-CM | POA: Diagnosis not present

## 2022-06-18 DIAGNOSIS — M47896 Other spondylosis, lumbar region: Secondary | ICD-10-CM | POA: Diagnosis not present

## 2022-06-18 DIAGNOSIS — M5136 Other intervertebral disc degeneration, lumbar region: Secondary | ICD-10-CM | POA: Diagnosis not present

## 2022-06-20 DIAGNOSIS — Z1331 Encounter for screening for depression: Secondary | ICD-10-CM | POA: Diagnosis not present

## 2022-06-20 DIAGNOSIS — Z1339 Encounter for screening examination for other mental health and behavioral disorders: Secondary | ICD-10-CM | POA: Diagnosis not present

## 2022-06-20 DIAGNOSIS — M2550 Pain in unspecified joint: Secondary | ICD-10-CM | POA: Diagnosis not present

## 2022-06-20 DIAGNOSIS — R635 Abnormal weight gain: Secondary | ICD-10-CM | POA: Diagnosis not present

## 2022-06-20 DIAGNOSIS — Z6832 Body mass index (BMI) 32.0-32.9, adult: Secondary | ICD-10-CM | POA: Diagnosis not present

## 2022-06-20 DIAGNOSIS — N951 Menopausal and female climacteric states: Secondary | ICD-10-CM | POA: Diagnosis not present

## 2022-06-23 DIAGNOSIS — E669 Obesity, unspecified: Secondary | ICD-10-CM | POA: Diagnosis not present

## 2022-06-23 DIAGNOSIS — E559 Vitamin D deficiency, unspecified: Secondary | ICD-10-CM | POA: Diagnosis not present

## 2022-06-25 DIAGNOSIS — R278 Other lack of coordination: Secondary | ICD-10-CM | POA: Diagnosis not present

## 2022-06-25 DIAGNOSIS — N393 Stress incontinence (female) (male): Secondary | ICD-10-CM | POA: Diagnosis not present

## 2022-06-25 DIAGNOSIS — R3914 Feeling of incomplete bladder emptying: Secondary | ICD-10-CM | POA: Diagnosis not present

## 2022-06-30 DIAGNOSIS — E559 Vitamin D deficiency, unspecified: Secondary | ICD-10-CM | POA: Diagnosis not present

## 2022-06-30 DIAGNOSIS — E669 Obesity, unspecified: Secondary | ICD-10-CM | POA: Diagnosis not present

## 2022-07-03 DIAGNOSIS — E538 Deficiency of other specified B group vitamins: Secondary | ICD-10-CM | POA: Diagnosis not present

## 2022-07-08 DIAGNOSIS — Z6832 Body mass index (BMI) 32.0-32.9, adult: Secondary | ICD-10-CM | POA: Diagnosis not present

## 2022-07-08 DIAGNOSIS — E7800 Pure hypercholesterolemia, unspecified: Secondary | ICD-10-CM | POA: Diagnosis not present

## 2022-07-15 DIAGNOSIS — E7800 Pure hypercholesterolemia, unspecified: Secondary | ICD-10-CM | POA: Diagnosis not present

## 2022-07-15 DIAGNOSIS — E559 Vitamin D deficiency, unspecified: Secondary | ICD-10-CM | POA: Diagnosis not present

## 2022-07-15 DIAGNOSIS — N951 Menopausal and female climacteric states: Secondary | ICD-10-CM | POA: Diagnosis not present

## 2022-07-22 DIAGNOSIS — Z6831 Body mass index (BMI) 31.0-31.9, adult: Secondary | ICD-10-CM | POA: Diagnosis not present

## 2022-07-22 DIAGNOSIS — E7800 Pure hypercholesterolemia, unspecified: Secondary | ICD-10-CM | POA: Diagnosis not present

## 2022-08-18 DIAGNOSIS — Z713 Dietary counseling and surveillance: Secondary | ICD-10-CM | POA: Diagnosis not present

## 2022-08-18 DIAGNOSIS — Z6832 Body mass index (BMI) 32.0-32.9, adult: Secondary | ICD-10-CM | POA: Diagnosis not present

## 2022-08-18 DIAGNOSIS — Z1231 Encounter for screening mammogram for malignant neoplasm of breast: Secondary | ICD-10-CM | POA: Diagnosis not present

## 2023-01-01 DIAGNOSIS — W57XXXA Bitten or stung by nonvenomous insect and other nonvenomous arthropods, initial encounter: Secondary | ICD-10-CM | POA: Diagnosis not present

## 2023-01-01 DIAGNOSIS — Z6831 Body mass index (BMI) 31.0-31.9, adult: Secondary | ICD-10-CM | POA: Diagnosis not present

## 2023-01-01 DIAGNOSIS — S80869A Insect bite (nonvenomous), unspecified lower leg, initial encounter: Secondary | ICD-10-CM | POA: Diagnosis not present

## 2023-01-04 ENCOUNTER — Emergency Department (HOSPITAL_COMMUNITY)
Admission: EM | Admit: 2023-01-04 | Discharge: 2023-01-04 | Disposition: A | Discharge status: Home / Self Care | Payer: BC Managed Care – PPO | Attending: Emergency Medicine | Admitting: Emergency Medicine

## 2023-01-04 ENCOUNTER — Encounter (HOSPITAL_COMMUNITY): Payer: Self-pay

## 2023-01-04 DIAGNOSIS — W57XXXA Bitten or stung by nonvenomous insect and other nonvenomous arthropods, initial encounter: Secondary | ICD-10-CM | POA: Diagnosis not present

## 2023-01-04 DIAGNOSIS — S80861A Insect bite (nonvenomous), right lower leg, initial encounter: Secondary | ICD-10-CM | POA: Diagnosis not present

## 2023-01-04 DIAGNOSIS — S80862A Insect bite (nonvenomous), left lower leg, initial encounter: Secondary | ICD-10-CM | POA: Diagnosis not present

## 2023-01-04 DIAGNOSIS — L03115 Cellulitis of right lower limb: Secondary | ICD-10-CM | POA: Diagnosis not present

## 2023-01-04 DIAGNOSIS — L03116 Cellulitis of left lower limb: Secondary | ICD-10-CM | POA: Insufficient documentation

## 2023-01-04 DIAGNOSIS — L03119 Cellulitis of unspecified part of limb: Secondary | ICD-10-CM

## 2023-01-04 NOTE — Discharge Instructions (Signed)
You were seen in the ER after an insect bite.  As we discussed, I want you to continue your antibiotics and using the antibiotic ointment. The darkness behind your knee is likely bruising. You can continue using ice for swelling, ibuprofen and/or tylenol as needed for pain.  Keep an eye out for tracking redness and if your bites develop any black/purple spots or if you develop any systemic symptoms like fever, chills, vomiting, fatigue.

## 2023-01-04 NOTE — ED Triage Notes (Signed)
Pt arrived via POV, c/o bug bites on the back of her left leg. [facility] and [facility] - has been on them since Thursday. States that they are now discolored and worsening. Denies any known fevers.

## 2023-01-04 NOTE — ED Provider Notes (Signed)
[place] EMERGENCY DEPARTMENT AT [facility] Provider Note   CSN: [id number] Arrival date & time: 01/04/23  7829     History  Chief Complaint  Patient presents with   Insect Bite    Jill Swanson is a 52 y.o. female with history of anemia, heart murmur, depression who presents emergency department complaining of insect bites.  Patient states that about 5 days ago she was outside in her backyard and thinks that she had some insect bites on both legs.  [facility] after the bite started to look worse.  She was placed on a course of doxycycline.  She is been taking as prescribed.  She was told to return for evaluation if the bites worsened in any way.  She states that there more discolored, and believes that they were draining some pus.  Denies any fever, chills, fatigue.  HPI     Home Medications Prior to Admission medications   Medication Sig Start Date End Date Taking? Authorizing Provider  acetaminophen (TYLENOL) 500 MG tablet Tylenol Extra Strength 500 mg tablet    [provider]  bisacodyl (DULCOLAX) 10 MG suppository Place 1 suppository (10 mg total) rectally daily as needed for moderate constipation. 05/10/22   Calvert Cantor, MD  cyclobenzaprine (FLEXERIL) 5 MG tablet Take 1-2 tablets (5-10 mg total) by mouth 3 (three) times daily as needed for muscle spasms. 05/10/22   Calvert Cantor, MD  HYDROcodone-acetaminophen (NORCO/VICODIN) 5-325 MG tablet Take 1-2 tablets by mouth every 6 (six) hours as needed for moderate pain. 05/10/22   Calvert Cantor, MD  polyethylene glycol (MIRALAX / GLYCOLAX) 17 g packet Take 17 g by mouth daily. 05/11/22   Calvert Cantor, MD  senna-docusate (SENOKOT-S) 8.6-50 MG tablet Take 1-2 tablets by mouth at bedtime as needed for mild constipation. 05/10/22   Calvert Cantor, MD      Allergies    Penicillins and Ibuprofen    Review of Systems   Review of Systems  Skin:  Positive for color change and  wound.  All other systems reviewed and are negative.   Physical Exam Updated Vital Signs BP (!) 150/95 (BP Location: Left Arm)   Pulse 100   Temp 97.7 F (36.5 C) (Oral)   Resp 16   SpO2 100%  Physical Exam Vitals and nursing note reviewed.  Constitutional:      Appearance: Normal appearance.  HENT:     Head: Normocephalic and atraumatic.  Eyes:     Conjunctiva/sclera: Conjunctivae normal.  Pulmonary:     Effort: Pulmonary effort is normal. No respiratory distress.  Musculoskeletal:     Comments: Normal range of motion of all joints of the legs  Compartments of the extremities are soft  Skin:    General: Skin is warm and dry.     Comments: Ulcer lesion on the posterior left knee as imaged below, with surrounding ecchymoses and mild erythema.  No increased warmth.  No fluctuance.   Pustular lesion on the right anterior lower leg just proximal to the ankle, with small amount of drainage and erythema  No redness streaking, no increased warmth  Neurological:     Mental Status: She is alert.  Psychiatric:        Mood and Affect: Mood normal.        Behavior: Behavior normal.     ED Results / Procedures / Treatments   Labs (all labs ordered are listed, but only abnormal results are displayed)  Labs Reviewed - No data to display  EKG None  Radiology No results found.  Procedures Procedures    Medications Ordered in ED Medications - No data to display  ED Course/ Medical Decision Making/ A&P                             Medical Decision Making  Patient is an otherwise healthy 52 year old female who presents emergency department with concern for infected insect bites.  She was bit by a unknown insect about 5 days ago.  2 days later she went to an urgent care and was given doxycycline with concern for infection.  She has been taking this course as prescribed.  She returns today with concern of the bites look different than before.  Denies any fever, chills,  fatigue.  On exam patient is clinically well-appearing, normal vital signs, no acute distress.  Does not meet SIRS.  She has a ulcerated lesion on her posterior left knee, with surrounding ecchymoses.  Concern for mild cellulitis, no streaking.  No fluctuance.  Another lesion on the right anterior leg that is pustular in nature.  Patient ranging all joints normally, compartments are soft.  Very low concern for emergent condition such as septic joint.  Bite seem most consistent with spider bites, with some mild cellulitis.  No evidence of necrosis.  Discussed with patient things I would want her to watch out for.  Advised that she continue her course of doxycycline, and using antibiotic ointment as she has been doing.  Patient discharged in stable condition all questions answered.  Final Clinical Impression(s) / ED Diagnoses Final diagnoses:  Insect bite of lower leg, unspecified laterality, initial encounter  Cellulitis of lower extremity, unspecified laterality    Rx / DC Orders ED Discharge Orders     None      Portions of this report may have been transcribed using voice recognition software. Every effort was made to ensure accuracy; however, inadvertent computerized transcription errors may be present.    Jeanella Flattery 01/04/23 1016    Gloris Manchester, MD 01/04/23 1246

## 2023-05-15 ENCOUNTER — Other Ambulatory Visit: Payer: Self-pay | Admitting: Family Medicine

## 2023-05-15 DIAGNOSIS — N951 Menopausal and female climacteric states: Secondary | ICD-10-CM | POA: Diagnosis not present

## 2023-05-15 DIAGNOSIS — R109 Unspecified abdominal pain: Secondary | ICD-10-CM

## 2023-05-22 ENCOUNTER — Ambulatory Visit
Admission: RE | Admit: 2023-05-22 | Discharge: 2023-05-22 | Disposition: A | Discharge status: Home / Self Care | Payer: BC Managed Care – PPO | Source: Ambulatory Visit | Attending: Family Medicine | Admitting: Family Medicine

## 2023-05-22 DIAGNOSIS — R109 Unspecified abdominal pain: Secondary | ICD-10-CM | POA: Diagnosis not present

## 2023-05-29 DIAGNOSIS — N951 Menopausal and female climacteric states: Secondary | ICD-10-CM | POA: Diagnosis not present

## 2023-07-06 ENCOUNTER — Other Ambulatory Visit (HOSPITAL_COMMUNITY)
Admission: RE | Admit: 2023-07-06 | Discharge: 2023-07-06 | Disposition: A | Discharge status: Home / Self Care | Payer: BC Managed Care – PPO | Source: Ambulatory Visit | Attending: Nurse Practitioner | Admitting: Nurse Practitioner

## 2023-07-06 DIAGNOSIS — Z124 Encounter for screening for malignant neoplasm of cervix: Secondary | ICD-10-CM | POA: Diagnosis not present

## 2023-07-06 DIAGNOSIS — Z01419 Encounter for gynecological examination (general) (routine) without abnormal findings: Secondary | ICD-10-CM | POA: Diagnosis not present

## 2023-07-09 LAB — CYTOLOGY - PAP
Comment: NEGATIVE
Diagnosis: NEGATIVE
High risk HPV: NEGATIVE

## 2023-11-30 DIAGNOSIS — S61231A Puncture wound without foreign body of left index finger without damage to nail, initial encounter: Secondary | ICD-10-CM | POA: Diagnosis not present

## 2023-12-02 DIAGNOSIS — L03221 Cellulitis of neck: Secondary | ICD-10-CM | POA: Diagnosis not present

## 2023-12-02 DIAGNOSIS — S1086XA Insect bite of other specified part of neck, initial encounter: Secondary | ICD-10-CM | POA: Diagnosis not present

## 2023-12-02 DIAGNOSIS — W57XXXA Bitten or stung by nonvenomous insect and other nonvenomous arthropods, initial encounter: Secondary | ICD-10-CM | POA: Diagnosis not present

## 2024-01-14 DIAGNOSIS — F411 Generalized anxiety disorder: Secondary | ICD-10-CM | POA: Diagnosis not present

## 2024-01-21 DIAGNOSIS — F411 Generalized anxiety disorder: Secondary | ICD-10-CM | POA: Diagnosis not present

## 2024-01-28 DIAGNOSIS — F411 Generalized anxiety disorder: Secondary | ICD-10-CM | POA: Diagnosis not present

## 2024-02-04 DIAGNOSIS — F411 Generalized anxiety disorder: Secondary | ICD-10-CM | POA: Diagnosis not present

## 2024-02-11 DIAGNOSIS — F411 Generalized anxiety disorder: Secondary | ICD-10-CM | POA: Diagnosis not present

## 2024-02-19 DIAGNOSIS — F411 Generalized anxiety disorder: Secondary | ICD-10-CM | POA: Diagnosis not present

## 2024-02-25 DIAGNOSIS — F411 Generalized anxiety disorder: Secondary | ICD-10-CM | POA: Diagnosis not present

## 2024-03-03 DIAGNOSIS — F411 Generalized anxiety disorder: Secondary | ICD-10-CM | POA: Diagnosis not present

## 2024-03-11 DIAGNOSIS — F411 Generalized anxiety disorder: Secondary | ICD-10-CM | POA: Diagnosis not present

## 2024-03-17 DIAGNOSIS — F411 Generalized anxiety disorder: Secondary | ICD-10-CM | POA: Diagnosis not present

## 2024-03-22 DIAGNOSIS — N951 Menopausal and female climacteric states: Secondary | ICD-10-CM | POA: Diagnosis not present

## 2024-03-24 DIAGNOSIS — F411 Generalized anxiety disorder: Secondary | ICD-10-CM | POA: Diagnosis not present

## 2024-03-31 DIAGNOSIS — F411 Generalized anxiety disorder: Secondary | ICD-10-CM | POA: Diagnosis not present

## 2024-04-07 DIAGNOSIS — F411 Generalized anxiety disorder: Secondary | ICD-10-CM | POA: Diagnosis not present

## 2024-06-02 DIAGNOSIS — F411 Generalized anxiety disorder: Secondary | ICD-10-CM | POA: Diagnosis not present

## 2024-06-12 DIAGNOSIS — R051 Acute cough: Secondary | ICD-10-CM | POA: Diagnosis not present

## 2024-06-12 DIAGNOSIS — M940 Chondrocostal junction syndrome [Tietze]: Secondary | ICD-10-CM | POA: Diagnosis not present

## 2024-06-16 DIAGNOSIS — F411 Generalized anxiety disorder: Secondary | ICD-10-CM | POA: Diagnosis not present

## 2024-06-23 DIAGNOSIS — F411 Generalized anxiety disorder: Secondary | ICD-10-CM | POA: Diagnosis not present

## 2024-06-30 DIAGNOSIS — F411 Generalized anxiety disorder: Secondary | ICD-10-CM | POA: Diagnosis not present

## 2024-07-11 DIAGNOSIS — N951 Menopausal and female climacteric states: Secondary | ICD-10-CM | POA: Diagnosis not present

## 2024-07-11 DIAGNOSIS — Z01419 Encounter for gynecological examination (general) (routine) without abnormal findings: Secondary | ICD-10-CM | POA: Diagnosis not present
